# Patient Record
Sex: Male | Born: 1950 | Race: White | Hispanic: No | Smoking: Never smoker
Health system: Southern US, Community
[De-identification: ages and names within clinical notes are randomized; demographics above are authoritative.]

## PROBLEM LIST (undated history)

## (undated) DIAGNOSIS — N138 Other obstructive and reflux uropathy: Secondary | ICD-10-CM

## (undated) DIAGNOSIS — I4510 Unspecified right bundle-branch block: Secondary | ICD-10-CM

## (undated) DIAGNOSIS — Z8719 Personal history of other diseases of the digestive system: Secondary | ICD-10-CM

## (undated) DIAGNOSIS — N401 Enlarged prostate with lower urinary tract symptoms: Secondary | ICD-10-CM

## (undated) DIAGNOSIS — Z978 Presence of other specified devices: Secondary | ICD-10-CM

## (undated) HISTORY — PX: KNEE ARTHROSCOPY: SUR90

## (undated) HISTORY — PX: MOUTH SURGERY: SHX715

## (undated) HISTORY — PX: CATARACT EXTRACTION W/ INTRAOCULAR LENS IMPLANT: SHX1309

## (undated) HISTORY — PX: TONSILLECTOMY: SUR1361

---
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
## 1994-12-10 HISTORY — PX: ACHILLES TENDON REPAIR: SUR1153

## 2006-07-21 ENCOUNTER — Emergency Department (HOSPITAL_COMMUNITY): Admission: EM | Admit: 2006-07-21 | Discharge: 2006-07-21 | Payer: Self-pay | Admitting: Emergency Medicine

## 2013-04-13 ENCOUNTER — Ambulatory Visit: Payer: Self-pay | Admitting: *Deleted

## 2016-02-10 DIAGNOSIS — E119 Type 2 diabetes mellitus without complications: Secondary | ICD-10-CM

## 2016-02-10 DIAGNOSIS — E782 Mixed hyperlipidemia: Secondary | ICD-10-CM

## 2016-02-10 HISTORY — DX: Mixed hyperlipidemia: E78.2

## 2016-02-10 HISTORY — DX: Type 2 diabetes mellitus without complications: E11.9

## 2016-08-10 HISTORY — PX: SHOULDER ARTHROSCOPY WITH ROTATOR CUFF REPAIR AND SUBACROMIAL DECOMPRESSION: SHX5686

## 2017-04-03 DIAGNOSIS — I10 Essential (primary) hypertension: Secondary | ICD-10-CM | POA: Diagnosis present

## 2017-04-03 HISTORY — DX: Essential (primary) hypertension: I10

## 2021-07-10 DIAGNOSIS — Z8616 Personal history of COVID-19: Secondary | ICD-10-CM

## 2021-07-10 DIAGNOSIS — R339 Retention of urine, unspecified: Secondary | ICD-10-CM

## 2021-07-10 HISTORY — DX: Personal history of COVID-19: Z86.16

## 2021-07-10 HISTORY — DX: Retention of urine, unspecified: R33.9

## 2021-07-25 ENCOUNTER — Emergency Department (HOSPITAL_COMMUNITY): Payer: Medicare PPO

## 2021-07-25 ENCOUNTER — Inpatient Hospital Stay (HOSPITAL_COMMUNITY)
Admission: EM | Admit: 2021-07-25 | Discharge: 2021-07-28 | DRG: 689 | Disposition: A | Discharge status: Home Health | Payer: Medicare PPO | Attending: Internal Medicine | Admitting: Internal Medicine

## 2021-07-25 ENCOUNTER — Encounter (HOSPITAL_COMMUNITY): Payer: Self-pay | Admitting: Emergency Medicine

## 2021-07-25 DIAGNOSIS — N320 Bladder-neck obstruction: Secondary | ICD-10-CM | POA: Diagnosis present

## 2021-07-25 DIAGNOSIS — G928 Other toxic encephalopathy: Secondary | ICD-10-CM | POA: Diagnosis present

## 2021-07-25 DIAGNOSIS — N136 Pyonephrosis: Principal | ICD-10-CM | POA: Diagnosis present

## 2021-07-25 DIAGNOSIS — G9341 Metabolic encephalopathy: Secondary | ICD-10-CM | POA: Diagnosis not present

## 2021-07-25 DIAGNOSIS — E872 Acidosis: Secondary | ICD-10-CM | POA: Diagnosis present

## 2021-07-25 DIAGNOSIS — Z781 Physical restraint status: Secondary | ICD-10-CM

## 2021-07-25 DIAGNOSIS — Z79899 Other long term (current) drug therapy: Secondary | ICD-10-CM

## 2021-07-25 DIAGNOSIS — Z7984 Long term (current) use of oral hypoglycemic drugs: Secondary | ICD-10-CM

## 2021-07-25 DIAGNOSIS — R338 Other retention of urine: Secondary | ICD-10-CM | POA: Diagnosis present

## 2021-07-25 DIAGNOSIS — N401 Enlarged prostate with lower urinary tract symptoms: Secondary | ICD-10-CM | POA: Diagnosis present

## 2021-07-25 DIAGNOSIS — N12 Tubulo-interstitial nephritis, not specified as acute or chronic: Secondary | ICD-10-CM | POA: Diagnosis present

## 2021-07-25 DIAGNOSIS — E119 Type 2 diabetes mellitus without complications: Secondary | ICD-10-CM

## 2021-07-25 DIAGNOSIS — R6520 Severe sepsis without septic shock: Secondary | ICD-10-CM

## 2021-07-25 DIAGNOSIS — A419 Sepsis, unspecified organism: Secondary | ICD-10-CM

## 2021-07-25 DIAGNOSIS — Z8616 Personal history of COVID-19: Secondary | ICD-10-CM

## 2021-07-25 DIAGNOSIS — E860 Dehydration: Secondary | ICD-10-CM | POA: Diagnosis present

## 2021-07-25 DIAGNOSIS — E1169 Type 2 diabetes mellitus with other specified complication: Secondary | ICD-10-CM | POA: Diagnosis present

## 2021-07-25 DIAGNOSIS — E871 Hypo-osmolality and hyponatremia: Secondary | ICD-10-CM | POA: Diagnosis present

## 2021-07-25 DIAGNOSIS — E782 Mixed hyperlipidemia: Secondary | ICD-10-CM | POA: Diagnosis present

## 2021-07-25 DIAGNOSIS — Z7982 Long term (current) use of aspirin: Secondary | ICD-10-CM

## 2021-07-25 DIAGNOSIS — N390 Urinary tract infection, site not specified: Secondary | ICD-10-CM

## 2021-07-25 DIAGNOSIS — I10 Essential (primary) hypertension: Secondary | ICD-10-CM | POA: Diagnosis present

## 2021-07-25 LAB — COMPREHENSIVE METABOLIC PANEL
ALT: 12 U/L (ref 0–44)
AST: 19 U/L (ref 15–41)
Albumin: 3.8 g/dL (ref 3.5–5.0)
Alkaline Phosphatase: 49 U/L (ref 38–126)
Anion gap: 13 (ref 5–15)
BUN: 7 mg/dL — ABNORMAL LOW (ref 8–23)
CO2: 21 mmol/L — ABNORMAL LOW (ref 22–32)
Calcium: 8.8 mg/dL — ABNORMAL LOW (ref 8.9–10.3)
Chloride: 93 mmol/L — ABNORMAL LOW (ref 98–111)
Creatinine, Ser: 1.25 mg/dL — ABNORMAL HIGH (ref 0.61–1.24)
GFR, Estimated: >60 mL/min (ref >60)
Glucose, Bld: 155 mg/dL — ABNORMAL HIGH (ref 70–99)
Potassium: 3.7 mmol/L (ref 3.5–5.1)
Sodium: 127 mmol/L — ABNORMAL LOW (ref 135–145)
Total Bilirubin: 1.2 mg/dL (ref 0.3–1.2)
Total Protein: 7.1 g/dL (ref 6.5–8.1)

## 2021-07-25 LAB — SARS CORONAVIRUS 2 (TAT 6-24 HRS): SARS Coronavirus 2: POSITIVE — AB

## 2021-07-25 LAB — I-STAT VENOUS BLOOD GAS, ED
Acid-base deficit: 10 mmol/L — ABNORMAL HIGH (ref 0.0–2.0)
Bicarbonate: 12.8 mmol/L — ABNORMAL LOW (ref 20.0–28.0)
Calcium, Ion: 0.76 mmol/L — CL (ref 1.15–1.40)
HCT: 23 % — ABNORMAL LOW (ref 39.0–52.0)
Hemoglobin: 7.8 g/dL — ABNORMAL LOW (ref 13.0–17.0)
O2 Saturation: 67 %
Potassium: 2.3 mmol/L — CL (ref 3.5–5.1)
Sodium: 141 mmol/L (ref 135–145)
TCO2: 13 mmol/L — ABNORMAL LOW (ref 22–32)
pCO2, Ven: 19.8 mmHg — CL (ref 44.0–60.0)
pH, Ven: 7.42 (ref 7.250–7.430)
pO2, Ven: 33 mmHg (ref 32.0–45.0)

## 2021-07-25 LAB — URINALYSIS, ROUTINE W REFLEX MICROSCOPIC
Bilirubin Urine: NEGATIVE
Glucose, UA: NEGATIVE mg/dL
Ketones, ur: NEGATIVE mg/dL
Nitrite: NEGATIVE
Protein, ur: 100 mg/dL — AB
Specific Gravity, Urine: 1.005 (ref 1.005–1.030)
WBC, UA: >50 WBC/hpf — ABNORMAL HIGH (ref 0–5)
pH: 6 (ref 5.0–8.0)

## 2021-07-25 LAB — TSH: TSH: 3.421 u[IU]/mL (ref 0.350–4.500)

## 2021-07-25 LAB — SALICYLATE LEVEL: Salicylate Lvl: <7 mg/dL — ABNORMAL LOW (ref 7.0–30.0)

## 2021-07-25 LAB — CBC WITH DIFFERENTIAL/PLATELET
Abs Immature Granulocytes: 0.08 10*3/uL — ABNORMAL HIGH (ref 0.00–0.07)
Basophils Absolute: 0 10*3/uL (ref 0.0–0.1)
Basophils Relative: 1 %
Eosinophils Absolute: 0.1 10*3/uL (ref 0.0–0.5)
Eosinophils Relative: 2 %
HCT: 38.8 % — ABNORMAL LOW (ref 39.0–52.0)
Hemoglobin: 12.8 g/dL — ABNORMAL LOW (ref 13.0–17.0)
Immature Granulocytes: 1 %
Lymphocytes Relative: 13 %
Lymphs Abs: 0.8 10*3/uL (ref 0.7–4.0)
MCH: 27.5 pg (ref 26.0–34.0)
MCHC: 33 g/dL (ref 30.0–36.0)
MCV: 83.3 fL (ref 80.0–100.0)
Monocytes Absolute: 0.4 10*3/uL (ref 0.1–1.0)
Monocytes Relative: 7 %
Neutro Abs: 4.7 10*3/uL (ref 1.7–7.7)
Neutrophils Relative %: 76 %
Platelets: 174 10*3/uL (ref 150–400)
RBC: 4.66 MIL/uL (ref 4.22–5.81)
RDW: 14.6 % (ref 11.5–15.5)
WBC: 6.2 10*3/uL (ref 4.0–10.5)
nRBC: 0 % (ref 0.0–0.2)

## 2021-07-25 LAB — LACTIC ACID, PLASMA
Lactic Acid, Venous: 3.1 mmol/L (ref 0.5–1.9)
Lactic Acid, Venous: 2.7 mmol/L (ref 0.5–1.9)

## 2021-07-25 LAB — ACETAMINOPHEN LEVEL: Acetaminophen (Tylenol), Serum: <10 ug/mL — ABNORMAL LOW (ref 10–30)

## 2021-07-25 LAB — APTT: aPTT: 29 seconds (ref 24–36)

## 2021-07-25 LAB — T4, FREE: Free T4: 1.08 ng/dL (ref 0.61–1.12)

## 2021-07-25 LAB — PROTIME-INR
INR: 1.1 (ref 0.8–1.2)
Prothrombin Time: 14 seconds (ref 11.4–15.2)

## 2021-07-25 MED ORDER — LORAZEPAM 2 MG/ML IJ SOLN
2.0000 mg | Freq: Once | INTRAMUSCULAR | Status: AC
  Administered 2021-07-25: 2 mg via INTRAVENOUS
  Filled 2021-07-25: qty 1

## 2021-07-25 MED ORDER — SODIUM CHLORIDE 0.9 % IV SOLN
1.0000 g | Freq: Once | INTRAVENOUS | Status: AC
  Administered 2021-07-25: 1 g via INTRAVENOUS
  Filled 2021-07-25: qty 10

## 2021-07-25 MED ORDER — DIPHENHYDRAMINE HCL 50 MG/ML IJ SOLN
25.0000 mg | Freq: Once | INTRAMUSCULAR | Status: AC
  Administered 2021-07-25: 25 mg via INTRAVENOUS
  Filled 2021-07-25: qty 1

## 2021-07-25 MED ORDER — HALOPERIDOL LACTATE 5 MG/ML IJ SOLN
2.0000 mg | Freq: Once | INTRAMUSCULAR | Status: AC
  Administered 2021-07-25: 2 mg via INTRAVENOUS
  Filled 2021-07-25: qty 1

## 2021-07-25 MED ORDER — SODIUM CHLORIDE 0.9 % IV BOLUS
1000.0000 mL | Freq: Once | INTRAVENOUS | Status: AC
  Administered 2021-07-25: 1000 mL via INTRAVENOUS

## 2021-07-25 MED ORDER — POTASSIUM CHLORIDE 10 MEQ/100ML IV SOLN
10.0000 meq | INTRAVENOUS | Status: AC
Start: 2021-07-25 — End: 2021-07-26
  Administered 2021-07-25 (×2): 10 meq via INTRAVENOUS
  Filled 2021-07-25 (×2): qty 100

## 2021-07-25 MED ORDER — LACTATED RINGERS IV SOLN: INTRAVENOUS | Status: AC

## 2021-07-25 NOTE — ED Provider Notes (Signed)
[facility] EMERGENCY DEPARTMENT Provider Note   CSN: [id number] Arrival date & time: 07/25/21  1903     History Chief Complaint  Patient presents with   Altered Mental Status    Matthew Garza is a 70 y.o. male.  70 yo male with history above presenting to ED for delirium. Last known normal around 1100 or 1300 today per family. Pt with complaint of urinary issues over the past week, suprapubic discomfort per EMS/family. No emesis. Patient acting abnormally this afternoon, confused, delirium. Agitated with EMS and required restraints, haldol was given by EMS with IVF. Patient grabbing at his crotch, winces when  abdomen is palpated at suprapubic region.   Level 5 caveat, AMS  The history is provided by the EMS personnel and the patient. The history is limited by the condition of the patient. No language interpreter was used.  Altered Mental Status Associated symptoms: abdominal pain       History reviewed. No pertinent past medical history.  There are no problems to display for this patient.   History reviewed. No pertinent surgical history.     History reviewed. No pertinent family history.     Home Medications Prior to Admission medications   Not on File    Allergies    Patient has no allergy information on record.  Review of Systems   Review of Systems  Unable to perform ROS: Mental status change  Gastrointestinal:  Positive for abdominal pain.  Genitourinary:  Positive for difficulty urinating and dysuria.   Physical Exam Updated Vital Signs BP (!) 143/78   Pulse 97   Temp 98.3 F (36.8 C) (Axillary)   Resp (!) 26   SpO2 (!) 89%   Physical Exam Vitals and nursing note reviewed.  Constitutional:      Appearance: Normal appearance.  HENT:     Head: Normocephalic and atraumatic.     Right Ear: External ear normal.     Left Ear: External ear normal.     Nose: Nose normal.     Mouth/Throat:     Mouth: Mucous membranes are moist.   Eyes:     General: No scleral icterus.       Right eye: No discharge.        Left eye: No discharge.  Cardiovascular:     Rate and Rhythm: Normal rate and regular rhythm.     Pulses: Normal pulses.     Heart sounds: No murmur heard. Pulmonary:     Effort: Pulmonary effort is normal. No respiratory distress.     Breath sounds: Normal breath sounds. No wheezing.  Abdominal:     General: Abdomen is flat.     Palpations: Abdomen is soft.     Tenderness: There is abdominal tenderness.     Comments: Suprapubic TTP, not peritoneal abdomen   Musculoskeletal:        General: Normal range of motion.     Cervical back: Normal range of motion.  Skin:    General: Skin is warm.     Capillary Refill: Capillary refill takes less than 2 seconds.  Neurological:     Mental Status: He is alert. He is disoriented and confused.     GCS: GCS eye subscore is 4. GCS verbal subscore is 4. GCS motor subscore is 5.     Motor: No seizure activity.     Comments: Poor cooperation with neuro exam. Responds to noxious stimuli in all extremities. Eyes and extremities will cross midline. Will intermittently  say his name in response to questioning.     ED Results / Procedures / Treatments   Labs (all labs ordered are listed, but only abnormal results are displayed) Labs Reviewed  SARS CORONAVIRUS 2 (TAT 6-24 HRS) - Abnormal; Notable for the following components:      Result Value   SARS Coronavirus 2 POSITIVE (*)    All other components within normal limits  LACTIC ACID, PLASMA - Abnormal; Notable for the following components:   Lactic Acid, Venous 3.1 (*)    All other components within normal limits  LACTIC ACID, PLASMA - Abnormal; Notable for the following components:   Lactic Acid, Venous 2.7 (*)    All other components within normal limits  COMPREHENSIVE METABOLIC PANEL - Abnormal; Notable for the following components:   Sodium 127 (*)    Chloride 93 (*)    CO2 21 (*)    Glucose, Bld 155 (*)    BUN  7 (*)    Creatinine, Ser 1.25 (*)    Calcium 8.8 (*)    All other components within normal limits  CBC WITH DIFFERENTIAL/PLATELET - Abnormal; Notable for the following components:   Hemoglobin 12.8 (*)    HCT 38.8 (*)    Abs Immature Granulocytes 0.08 (*)    All other components within normal limits  URINALYSIS, ROUTINE W REFLEX MICROSCOPIC - Abnormal; Notable for the following components:   Color, Urine AMBER (*)    APPearance CLOUDY (*)    Hgb urine dipstick MODERATE (*)    Protein, ur 100 (*)    Leukocytes,Ua LARGE (*)    WBC, UA >50 (*)    Bacteria, UA MANY (*)    All other components within normal limits  SALICYLATE LEVEL - Abnormal; Notable for the following components:   Salicylate Lvl <7.0 (*)    All other components within normal limits  ACETAMINOPHEN LEVEL - Abnormal; Notable for the following components:   Acetaminophen (Tylenol), Serum <10 (*)    All other components within normal limits  I-STAT VENOUS BLOOD GAS, ED - Abnormal; Notable for the following components:   pCO2, Ven 19.8 (*)    Bicarbonate 12.8 (*)    TCO2 13 (*)    Acid-base deficit 10.0 (*)    Potassium 2.3 (*)    Calcium, Ion 0.76 (*)    HCT 23.0 (*)    Hemoglobin 7.8 (*)    All other components within normal limits  CULTURE, BLOOD (SINGLE)  URINE CULTURE  PROTIME-INR  APTT  TSH  T4, FREE  AMMONIA  RAPID URINE DRUG SCREEN, HOSP PERFORMED    EKG None  Radiology CT HEAD WO CONTRAST ( )  Result Date: 07/26/2021 CLINICAL DATA:  Delirium EXAM: CT HEAD WITHOUT CONTRAST TECHNIQUE: Contiguous axial images were obtained from the base of the skull through the vertex without intravenous contrast. COMPARISON:  None. FINDINGS: Brain: No evidence of acute infarction, hemorrhage, hydrocephalus, extra-axial collection or mass lesion/mass effect. Mild cortical atrophy. Mild subcortical white matter and periventricular small vessel ischemic changes. Vascular: No hyperdense vessel or unexpected  calcification. Skull: Normal. Negative for fracture or focal lesion. Sinuses/Orbits: The visualized paranasal sinuses are essentially clear. The mastoid air cells are unopacified. Other: None. IMPRESSION: No evidence of acute intracranial abnormality. Atrophy with mild small vessel ischemic changes. Electronically Signed   By: Charline Bills M.D.   On: 07/26/2021 00:19   CT ABDOMEN PELVIS W CONTRAST  Result Date: 07/26/2021 CLINICAL DATA:  Abdominal pain. EXAM: CT ABDOMEN AND PELVIS WITH  CONTRAST TECHNIQUE: Multidetector CT imaging of the abdomen and pelvis was performed using the standard protocol following bolus administration of intravenous contrast. CONTRAST:  85mL OMNIPAQUE IOHEXOL 350 MG/ML SOLN COMPARISON:  None. FINDINGS: Lower chest: Mild atelectasis is seen within the posterior aspects of the bilateral lung bases. Very small bilateral pleural effusions are noted. Hepatobiliary: A 5 mm focus of parenchymal low attenuation is seen within the right lobe of the liver. This is too small to characterize by CT examination. No gallstones, gallbladder wall thickening, or biliary dilatation. Pancreas: Unremarkable. No pancreatic ductal dilatation or surrounding inflammatory changes. Spleen: Normal in size without focal abnormality. Adrenals/Urinary Tract: Adrenal glands are unremarkable. Kidneys are normal in size, without focal lesions. Moderate severity bilateral hydronephrosis and hydroureter are noted, to the level of a markedly distended urinary bladder. Moderate to marked severity bilateral perinephric inflammatory fat stranding is also seen. There is mild inflammatory fat stranding surrounding the urinary bladder. Stomach/Bowel: Stomach is within normal limits. Appendix appears normal. No evidence of bowel wall thickening, distention, or inflammatory changes. Vascular/Lymphatic: Aortic atherosclerosis. No enlarged abdominal or pelvic lymph nodes. Reproductive: Moderate to marked severity prostate gland  enlargement is seen Other: No abdominal wall hernia or abnormality. No abdominopelvic ascites. Musculoskeletal: Degenerative changes are seen within the mid and lower lumbar spine. IMPRESSION: 1. Markedly distended urinary bladder with subsequent bilateral hydronephrosis and hydroureter, likely a result of urinary bladder outlet obstruction secondary to an enlarged prostate gland. 2. Additional findings consistent with mild cystitis. 3. While the perinephric inflammatory fat stranding is nonspecific in nature, sequelae associated with acute pyelonephritis cannot be excluded. Electronically Signed   By: Aram Candela M.D.   On: 07/26/2021 00:40   DG Chest Port 1 View  Result Date: 07/25/2021 CLINICAL DATA:  Sepsis EXAM: PORTABLE CHEST 1 VIEW COMPARISON:  None. FINDINGS: The heart size and mediastinal contours are within normal limits. Both lungs are clear. The visualized skeletal structures are unremarkable. IMPRESSION: No active disease. Electronically Signed   By: Deatra Robinson M.D.   On: 07/25/2021 21:13    Procedures .Critical Care  Date/Time: 07/26/2021 12:47 AM Performed by: Sloan Leiter, DO Authorized by: Sloan Leiter, DO   Critical care provider statement:    Critical care time (minutes):  36   Critical care time was exclusive of:  Separately billable procedures and treating other patients   Critical care was necessary to treat or prevent imminent or life-threatening deterioration of the following conditions:  Sepsis   Critical care was time spent personally by me on the following activities:  Discussions with consultants, evaluation of patient's response to treatment, examination of patient, ordering and performing treatments and interventions, ordering and review of laboratory studies, ordering and review of radiographic studies, pulse oximetry, re-evaluation of patient's condition, obtaining history from patient or surrogate, review of old charts and development of treatment plan  with patient or surrogate   Medications Ordered in ED Medications  potassium chloride 10 mEq in 100 mL IVPB (0 mEq Intravenous Stopped 07/26/21 0044)  lactated ringers infusion ( Intravenous New Bag/Given 07/25/21 2305)  LORazepam (ATIVAN) injection 2 mg (2 mg Intravenous Given 07/25/21 1941)  sodium chloride 0.9 % bolus 1,000 mL (0 mLs Intravenous Stopped 07/25/21 2243)  haloperidol lactate (HALDOL) injection 2 mg (2 mg Intravenous Given 07/25/21 2015)  diphenhydrAMINE (BENADRYL) injection 25 mg (25 mg Intravenous Given 07/25/21 2015)  cefTRIAXone (ROCEPHIN) 1 g in sodium chloride 0.9 % 100 mL IVPB (0 g Intravenous Stopped 07/25/21 2215)  LORazepam (  ATIVAN) injection 2 mg (2 mg Intravenous Given 07/25/21 2117)  LORazepam (ATIVAN) injection 2 mg (2 mg Intravenous Given 07/25/21 2305)  diphenhydrAMINE (BENADRYL) injection 25 mg (25 mg Intravenous Given 07/25/21 2307)  iohexol (OMNIPAQUE) 350 MG/ML injection 80 mL (80 mLs Intravenous Contrast Given 07/26/21 0017)    ED Course  I have reviewed the triage vital signs and the nursing notes.  Pertinent labs & imaging results that were available during my care of the patient were reviewed by me and considered in my medical decision making (see chart for details).    MDM Rules/Calculators/A&P                           70 yo male with history as above presenting to ED 2/2 AMS. Concern for GU infection per family. Pt requiring chemical and physical restraints. He is combative, encephalopathic on exam. Abdomen tender supra-pubic. Vital signs reviewed, tachypnea. Serious etiology considered.  Labs obtained and are concerning for sepsis. Blood cultures obtained. UA consistent with infection, urine culture sent. Start patient on rocephin. IVF. His mentation seems to be improving but he continues to require soft restraints and intermittent sedative medication dosing. Sitter requested. Patient with also mild dehydration, IVF in progress.  CT imaging reviewed,  CTH is stable. CT abdomen/pelvis concerning for bladder outlet obstruction likely 2/2 enlarged prostate, distended bladder. Will attempt in and out catheter. Cystitis noted. Pt with also possible pyelonephritis. Continue current ABX.   Re-assessed, mental status improving.   Pt with severe sepsis, metabolic encephalopathy likely 2/2 UTI vs Pyelonephritis. Recommend admission, family is agreeable. D/w admitting team who accepts patient for admission.    Final Clinical Impression(s) / ED Diagnoses Final diagnoses:  Metabolic encephalopathy  Severe sepsis (HCC)  Urinary tract infection with hematuria, site unspecified  Dehydration  Hyponatremia  Bladder obstruction    Rx / DC Orders ED Discharge Orders     None        Sloan LeiterGray, Stanly Si A, DO 07/26/21 0102

## 2021-07-25 NOTE — ED Notes (Signed)
Pt taken to ct, was unable to obtain due to movement, gave patient 2 of ativan per md, able to obtain chest x ray, cleaned patient of soiled garments, will attempt to obtain ekg once ativan takes effect

## 2021-07-25 NOTE — ED Triage Notes (Signed)
Pt BIB [facility] from home C/O AMS. Per EMS, family reports possible urinary frequency X 2 days, "got over COVID 5 days ago." 18 G LFA 500 mL NS, 2.5 Haldol en route BG 148 140/90 HR 104 96% RA  LKW 1300 today

## 2021-07-26 ENCOUNTER — Encounter (HOSPITAL_COMMUNITY): Payer: Self-pay | Admitting: Emergency Medicine

## 2021-07-26 ENCOUNTER — Emergency Department (HOSPITAL_COMMUNITY): Payer: Medicare PPO

## 2021-07-26 DIAGNOSIS — I10 Essential (primary) hypertension: Secondary | ICD-10-CM | POA: Diagnosis present

## 2021-07-26 DIAGNOSIS — G9341 Metabolic encephalopathy: Secondary | ICD-10-CM | POA: Diagnosis present

## 2021-07-26 DIAGNOSIS — N401 Enlarged prostate with lower urinary tract symptoms: Secondary | ICD-10-CM | POA: Diagnosis present

## 2021-07-26 DIAGNOSIS — E119 Type 2 diabetes mellitus without complications: Secondary | ICD-10-CM

## 2021-07-26 DIAGNOSIS — N12 Tubulo-interstitial nephritis, not specified as acute or chronic: Secondary | ICD-10-CM

## 2021-07-26 DIAGNOSIS — R338 Other retention of urine: Secondary | ICD-10-CM | POA: Diagnosis present

## 2021-07-26 DIAGNOSIS — G928 Other toxic encephalopathy: Secondary | ICD-10-CM | POA: Diagnosis present

## 2021-07-26 DIAGNOSIS — Z79899 Other long term (current) drug therapy: Secondary | ICD-10-CM | POA: Diagnosis not present

## 2021-07-26 DIAGNOSIS — Z7984 Long term (current) use of oral hypoglycemic drugs: Secondary | ICD-10-CM | POA: Diagnosis not present

## 2021-07-26 DIAGNOSIS — E1169 Type 2 diabetes mellitus with other specified complication: Secondary | ICD-10-CM | POA: Diagnosis present

## 2021-07-26 DIAGNOSIS — E860 Dehydration: Secondary | ICD-10-CM | POA: Diagnosis present

## 2021-07-26 DIAGNOSIS — Z8616 Personal history of COVID-19: Secondary | ICD-10-CM | POA: Diagnosis not present

## 2021-07-26 DIAGNOSIS — E782 Mixed hyperlipidemia: Secondary | ICD-10-CM | POA: Diagnosis present

## 2021-07-26 DIAGNOSIS — Z7982 Long term (current) use of aspirin: Secondary | ICD-10-CM | POA: Diagnosis not present

## 2021-07-26 DIAGNOSIS — N320 Bladder-neck obstruction: Secondary | ICD-10-CM | POA: Diagnosis present

## 2021-07-26 DIAGNOSIS — E871 Hypo-osmolality and hyponatremia: Secondary | ICD-10-CM | POA: Diagnosis present

## 2021-07-26 DIAGNOSIS — E872 Acidosis: Secondary | ICD-10-CM | POA: Diagnosis present

## 2021-07-26 DIAGNOSIS — N136 Pyonephrosis: Secondary | ICD-10-CM | POA: Diagnosis present

## 2021-07-26 DIAGNOSIS — Z781 Physical restraint status: Secondary | ICD-10-CM | POA: Diagnosis not present

## 2021-07-26 HISTORY — DX: Type 2 diabetes mellitus with other specified complication: E11.69

## 2021-07-26 LAB — COMPREHENSIVE METABOLIC PANEL
ALT: 13 U/L (ref 0–44)
AST: 19 U/L (ref 15–41)
Albumin: 3.4 g/dL — ABNORMAL LOW (ref 3.5–5.0)
Alkaline Phosphatase: 41 U/L (ref 38–126)
Anion gap: 7 (ref 5–15)
BUN: 6 mg/dL — ABNORMAL LOW (ref 8–23)
CO2: 24 mmol/L (ref 22–32)
Calcium: 8.7 mg/dL — ABNORMAL LOW (ref 8.9–10.3)
Chloride: 98 mmol/L (ref 98–111)
Creatinine, Ser: 1.27 mg/dL — ABNORMAL HIGH (ref 0.61–1.24)
GFR, Estimated: >60 mL/min (ref >60)
Glucose, Bld: 137 mg/dL — ABNORMAL HIGH (ref 70–99)
Potassium: 4.2 mmol/L (ref 3.5–5.1)
Sodium: 129 mmol/L — ABNORMAL LOW (ref 135–145)
Total Bilirubin: 1.2 mg/dL (ref 0.3–1.2)
Total Protein: 6.4 g/dL — ABNORMAL LOW (ref 6.5–8.1)

## 2021-07-26 LAB — MAGNESIUM: Magnesium: 1.6 mg/dL — ABNORMAL LOW (ref 1.7–2.4)

## 2021-07-26 LAB — CBG MONITORING, ED
Glucose-Capillary: 104 mg/dL — ABNORMAL HIGH (ref 70–99)
Glucose-Capillary: 170 mg/dL — ABNORMAL HIGH (ref 70–99)
Glucose-Capillary: 135 mg/dL — ABNORMAL HIGH (ref 70–99)

## 2021-07-26 LAB — CBC WITH DIFFERENTIAL/PLATELET
Abs Immature Granulocytes: 0.09 10*3/uL — ABNORMAL HIGH (ref 0.00–0.07)
Basophils Absolute: 0 10*3/uL (ref 0.0–0.1)
Basophils Relative: 0 %
Eosinophils Absolute: 0 10*3/uL (ref 0.0–0.5)
Eosinophils Relative: 0 %
HCT: 36.4 % — ABNORMAL LOW (ref 39.0–52.0)
Hemoglobin: 12.3 g/dL — ABNORMAL LOW (ref 13.0–17.0)
Immature Granulocytes: 1 %
Lymphocytes Relative: 8 %
Lymphs Abs: 0.7 10*3/uL (ref 0.7–4.0)
MCH: 28.1 pg (ref 26.0–34.0)
MCHC: 33.8 g/dL (ref 30.0–36.0)
MCV: 83.1 fL (ref 80.0–100.0)
Monocytes Absolute: 0.8 10*3/uL (ref 0.1–1.0)
Monocytes Relative: 9 %
Neutro Abs: 7.4 10*3/uL (ref 1.7–7.7)
Neutrophils Relative %: 82 %
Platelets: 165 10*3/uL (ref 150–400)
RBC: 4.38 MIL/uL (ref 4.22–5.81)
RDW: 14.3 % (ref 11.5–15.5)
WBC: 9.1 10*3/uL (ref 4.0–10.5)
nRBC: 0 % (ref 0.0–0.2)

## 2021-07-26 LAB — RAPID URINE DRUG SCREEN, HOSP PERFORMED
Amphetamines: NOT DETECTED
Barbiturates: NOT DETECTED
Benzodiazepines: POSITIVE — AB
Cocaine: NOT DETECTED
Opiates: NOT DETECTED
Tetrahydrocannabinol: NOT DETECTED

## 2021-07-26 LAB — PROTIME-INR
INR: 1.1 (ref 0.8–1.2)
Prothrombin Time: 14.6 seconds (ref 11.4–15.2)

## 2021-07-26 LAB — HEMOGLOBIN A1C
Hgb A1c MFr Bld: 6.3 % — ABNORMAL HIGH (ref 4.8–5.6)
Mean Plasma Glucose: 134.11 mg/dL

## 2021-07-26 LAB — LACTIC ACID, PLASMA: Lactic Acid, Venous: 1 mmol/L (ref 0.5–1.9)

## 2021-07-26 LAB — GLUCOSE, CAPILLARY: Glucose-Capillary: 112 mg/dL — ABNORMAL HIGH (ref 70–99)

## 2021-07-26 LAB — AMMONIA: Ammonia: 21 umol/L (ref 9–35)

## 2021-07-26 LAB — APTT: aPTT: 29 seconds (ref 24–36)

## 2021-07-26 MED ORDER — SODIUM CHLORIDE 0.9 % IV SOLN: INTRAVENOUS | Status: DC

## 2021-07-26 MED ORDER — LOSARTAN POTASSIUM 50 MG PO TABS
50.0000 mg | ORAL_TABLET | Freq: Every day | ORAL | Status: DC
  Administered 2021-07-26: 50 mg via ORAL
  Filled 2021-07-26: qty 1

## 2021-07-26 MED ORDER — ASPIRIN EC 81 MG PO TBEC
81.0000 mg | DELAYED_RELEASE_TABLET | Freq: Every day | ORAL | Status: DC
  Administered 2021-07-26 – 2021-07-28 (×3): 81 mg via ORAL
  Filled 2021-07-26 (×3): qty 1

## 2021-07-26 MED ORDER — IOHEXOL 350 MG/ML SOLN
80.0000 mL | Freq: Once | INTRAVENOUS | Status: AC | PRN
  Administered 2021-07-26: 80 mL via INTRAVENOUS

## 2021-07-26 MED ORDER — TAMSULOSIN HCL 0.4 MG PO CAPS
0.8000 mg | ORAL_CAPSULE | Freq: Every day | ORAL | Status: DC
  Administered 2021-07-26: 0.8 mg via ORAL
  Filled 2021-07-26: qty 2

## 2021-07-26 MED ORDER — POTASSIUM CHLORIDE 10 MEQ/100ML IV SOLN
INTRAVENOUS | Status: AC
  Administered 2021-07-26: 10 meq via INTRAVENOUS
  Filled 2021-07-26: qty 100

## 2021-07-26 MED ORDER — ONDANSETRON HCL 4 MG/2ML IJ SOLN: 4.0000 mg | Freq: Four times a day (QID) | INTRAMUSCULAR | Status: DC | PRN

## 2021-07-26 MED ORDER — SODIUM CHLORIDE 0.9 % IV SOLN
1.0000 g | INTRAVENOUS | Status: DC
  Administered 2021-07-26 – 2021-07-27 (×2): 1 g via INTRAVENOUS
  Filled 2021-07-26 (×2): qty 10

## 2021-07-26 MED ORDER — SODIUM CHLORIDE 0.9 % IV SOLN: INTRAVENOUS | Status: AC

## 2021-07-26 MED ORDER — ACETAMINOPHEN 325 MG PO TABS: 650.0000 mg | ORAL_TABLET | Freq: Four times a day (QID) | ORAL | Status: DC | PRN

## 2021-07-26 MED ORDER — POLYETHYLENE GLYCOL 3350 17 G PO PACK: 17.0000 g | PACK | Freq: Every day | ORAL | Status: DC | PRN

## 2021-07-26 MED ORDER — ONDANSETRON HCL 4 MG PO TABS: 4.0000 mg | ORAL_TABLET | Freq: Four times a day (QID) | ORAL | Status: DC | PRN

## 2021-07-26 MED ORDER — ACETAMINOPHEN 650 MG RE SUPP: 650.0000 mg | Freq: Four times a day (QID) | RECTAL | Status: DC | PRN

## 2021-07-26 MED ORDER — CHLORHEXIDINE GLUCONATE CLOTH 2 % EX PADS
6.0000 | MEDICATED_PAD | Freq: Every day | CUTANEOUS | Status: DC
  Administered 2021-07-27 – 2021-07-28 (×2): 6 via TOPICAL

## 2021-07-26 MED ORDER — SIMVASTATIN 20 MG PO TABS
40.0000 mg | ORAL_TABLET | Freq: Every day | ORAL | Status: DC
  Administered 2021-07-26 – 2021-07-28 (×3): 40 mg via ORAL
  Filled 2021-07-26 (×3): qty 2

## 2021-07-26 MED ORDER — TAMSULOSIN HCL 0.4 MG PO CAPS
0.4000 mg | ORAL_CAPSULE | Freq: Every day | ORAL | Status: DC
  Administered 2021-07-27 – 2021-07-28 (×2): 0.4 mg via ORAL
  Filled 2021-07-26 (×2): qty 1

## 2021-07-26 MED ORDER — INSULIN ASPART 100 UNIT/ML IJ SOLN
0.0000 [IU] | Freq: Three times a day (TID) | INTRAMUSCULAR | Status: DC
  Administered 2021-07-26: 3 [IU] via SUBCUTANEOUS
  Administered 2021-07-26 – 2021-07-27 (×3): 2 [IU] via SUBCUTANEOUS
  Administered 2021-07-28: 3 [IU] via SUBCUTANEOUS

## 2021-07-26 NOTE — ED Notes (Signed)
Attempted in & out cath. Several clots noted when attempting cath. Little urine return. Will make MD aware.

## 2021-07-26 NOTE — ED Notes (Signed)
Bladder scanner read >971ml. MD made aware.

## 2021-07-26 NOTE — H&P (Signed)
History and Physical    Matthew Garza ZYS:[id number] DOB: October 19, 1951 DOA: 07/25/2021  PCP: Pcp, No  Patient coming from: Home via EMS   Chief Complaint:  Chief Complaint  Patient presents with   Altered Mental Status     HPI:    70 year old male with past medical history of diabetes mellitus type 2, hypertension, hyperlipidemia, benign prostatic hyperplasia who presents with a several day history of progressively worsening abdominal discomfort and confusion.  Patient is unable to provide significant history due to severe confusion.  Majority the history has been obtained from the daughter via phone conversation.  According to the daughter, the entire family was diagnosed with COVID-19 on 8/5 via home testing after one family member who works in a daycare center ended up being positive.  The patient experienced no symptoms with this illness except for some fatigue.  Several days later, the patient began to complain of some lower abdominal pain and dysuria.  The daughter recalls that she and the rest of the family thought that this was related to the patient's recent COVID-19 illness.  As the days progressed patient continued to complain of lower abdominal pain and dysuria and she insisted that her father (who lives alone) call his primary care provider to discuss his progressive symptoms.  She states that he did not.  Of note, patient was complaining of urinary obstructive symptoms to his primary care provider as early as 10/2020.  Patient was not placed on any medical therapies for this at that time however.  Daughter explained that within the past 24 hours the patient had suddenly become extremely confused, somewhat lethargic with periods of agitation.  She contacted EMS who promptly arrived and upon their arrival patient was extremely agitated requiring EMS to implement restraints.  EMS proceeded to bring the patient into [facility] emergency department for evaluation  Upon  evaluation in the emergency department CT imaging of the abdomen pelvis revealed a markedly distended bladder with bilateral hydronephrosis and hydroureter thought to be secondary to bladder outlet obstruction due to enlarged prostate.  There is also evidence of bilateral perinephric fat stranding which could possibly be secondary pyelonephritis.  Urinalysis was obtained revealing greater than 50 white blood cells per high-powered field as well as leukocyte esterase concerning for urinary tract infection.  Patient was initiated on intravenous ceftriaxone.  Patient was given 1 L normal saline fluid bolus.  The hospitalist group was then called to assess the patient for admission to the hospital.   Review of Systems:   Review of Systems  Unable to perform ROS: Mental status change   Past Medical History:  Diagnosis Date   Essential hypertension 04/03/2017   Mixed diabetic hyperlipidemia associated with type 2 diabetes mellitus (HCC) 07/26/2021   Mixed hyperlipidemia 02/10/2016   Type 2 diabetes mellitus without complication, without long-term current use of insulin (HCC) 02/10/2016    History reviewed. No pertinent surgical history.   reports that he has never smoked. He has never used smokeless tobacco. No history on file for alcohol use and drug use.  No Known Allergies  Family History  Problem Relation Age of Onset   Heart disease Neg Hx      Prior to Admission medications   Medication Sig Start Date End Date Taking? Authorizing Provider  Aspirin 81 MG CAPS Take 81 mg by mouth daily.   Yes [provider]  diazepam (VALIUM) 10 MG tablet Take 10 mg by mouth at bedtime as needed for sleep.  Yes [provider]  losartan (COZAAR) 50 MG tablet Take 50 mg by mouth daily.   Yes [provider]  metFORMIN (GLUCOPHAGE) 1000 MG tablet Take 1,000 mg by mouth daily with breakfast.   Yes [provider]  sildenafil (VIAGRA) 100 MG tablet Take 100 mg by mouth  daily as needed for erectile dysfunction.   Yes [provider]  simvastatin (ZOCOR) 40 MG tablet Take 40 mg by mouth daily.   Yes [provider]  sitaGLIPtin (JANUVIA) 100 MG tablet Take 100 mg by mouth daily.   Yes [provider]    Physical Exam: Vitals:   07/26/21 0430 07/26/21 0630 07/26/21 0700 07/26/21 0730  BP: (!) 144/82 134/73 (!) 141/75 (!) 141/77  Pulse: 99 88 95 91  Resp: (!) 22   20  Temp:      TempSrc:      SpO2: 95% 96% 99% 97%    Constitutional: Lethargic but arousable, oriented x1, no associated distress.   Skin: no rashes, no lesions, poor skin turgor noted. Eyes: Pupils are equally reactive to light.  No evidence of scleral icterus or conjunctival pallor.  ENMT: Extremely dry mucous membranes noted.  Posterior pharynx clear of any exudate or lesions.   Neck: normal, supple, no masses, no thyromegaly.  No evidence of jugular venous distension.   Respiratory: clear to auscultation bilaterally, no wheezing, no crackles. Normal respiratory effort. No accessory muscle use.  Cardiovascular: Regular rate and rhythm, no murmurs / rubs / gallops. No extremity edema. 2+ pedal pulses. No carotid bruits.  Chest:   Nontender without crepitus or deformity.   Back:   Nontender without crepitus or deformity. Abdomen: Notable lower abdominal tenderness.  Abdomen is soft however.  Lower abdominal fullness has improved with insertion of Foley catheter.    Positive bowel sounds noted in all quadrants.   GU: Foley catheter in place draining slightly rose tinted urine.  Musculoskeletal: No joint deformity upper and lower extremities. Good ROM, no contractures. Normal muscle tone.  Neurologic: Lethargic but arousable and oriented x1.  Patient is not consistently following commands.  Patient is responsive to verbal and painful stimuli.  Patient is moving all 4 extremities.  Patient's sensation is grossly intact.   Psychiatric: Unable to assess due to severe  lethargy and confusion.  Patient currently does not seem to possess insight as to their current situation.  Labs on Admission: I have personally reviewed following labs and imaging studies -   CBC: Recent Labs  Lab 07/25/21 1908 07/25/21 1941 07/26/21 0513  WBC 6.2  --  9.1  NEUTROABS 4.7  --  7.4  HGB 12.8* 7.8* 12.3*  HCT 38.8* 23.0* 36.4*  MCV 83.3  --  83.1  PLT 174  --  165   Basic Metabolic Panel: Recent Labs  Lab 07/25/21 1908 07/25/21 1941 07/26/21 0513  NA 127* 141 129*  K 3.7 2.3* 4.2  CL 93*  --  98  CO2 21*  --  24  GLUCOSE 155*  --  137*  BUN 7*  --  6*  CREATININE 1.25*  --  1.27*  CALCIUM 8.8*  --  8.7*  MG  --   --  1.6*   GFR: CrCl cannot be calculated (Unknown ideal weight.). Liver Function Tests: Recent Labs  Lab 07/25/21 1908 07/26/21 0513  AST 19 19  ALT 12 13  ALKPHOS 49 41  BILITOT 1.2 1.2  PROT 7.1 6.4*  ALBUMIN 3.8 3.4*   No results  for input(s): LIPASE, AMYLASE in the last 168 hours. Recent Labs  Lab 07/26/21 0221  AMMONIA 21   Coagulation Profile: Recent Labs  Lab 07/25/21 1908 07/26/21 0513  INR 1.1 1.1   Cardiac Enzymes: No results for input(s): CKTOTAL, CKMB, CKMBINDEX, TROPONINI in the last 168 hours. BNP (last 3 results) No results for input(s): PROBNP in the last 8760 hours. HbA1C: Recent Labs    07/26/21 0513  HGBA1C 6.3*   CBG: Recent Labs  Lab 07/26/21 0746  GLUCAP 104*   Lipid Profile: No results for input(s): CHOL, HDL, LDLCALC, TRIG, CHOLHDL, LDLDIRECT in the last 72 hours. Thyroid Function Tests: Recent Labs    07/25/21 1908 07/25/21 1910  TSH  --  3.421  FREET4 1.08  --    Anemia Panel: No results for input(s): VITAMINB12, FOLATE, FERRITIN, TIBC, IRON, RETICCTPCT in the last 72 hours. Urine analysis:    Component Value Date/Time   COLORURINE AMBER (A) 07/25/2021 1908   APPEARANCEUR CLOUDY (A) 07/25/2021 1908   LABSPEC 1.005 07/25/2021 1908   PHURINE 6.0 07/25/2021 1908   GLUCOSEU  NEGATIVE 07/25/2021 1908   HGBUR MODERATE (A) 07/25/2021 1908   BILIRUBINUR NEGATIVE 07/25/2021 1908   KETONESUR NEGATIVE 07/25/2021 1908   PROTEINUR 100 (A) 07/25/2021 1908   NITRITE NEGATIVE 07/25/2021 1908   LEUKOCYTESUR LARGE (A) 07/25/2021 1908    Radiological Exams on Admission - Personally Reviewed: CT HEAD WO CONTRAST ( )  Result Date: 07/26/2021 CLINICAL DATA:  Delirium EXAM: CT HEAD WITHOUT CONTRAST TECHNIQUE: Contiguous axial images were obtained from the base of the skull through the vertex without intravenous contrast. COMPARISON:  None. FINDINGS: Brain: No evidence of acute infarction, hemorrhage, hydrocephalus, extra-axial collection or mass lesion/mass effect. Mild cortical atrophy. Mild subcortical white matter and periventricular small vessel ischemic changes. Vascular: No hyperdense vessel or unexpected calcification. Skull: Normal. Negative for fracture or focal lesion. Sinuses/Orbits: The visualized paranasal sinuses are essentially clear. The mastoid air cells are unopacified. Other: None. IMPRESSION: No evidence of acute intracranial abnormality. Atrophy with mild small vessel ischemic changes. Electronically Signed   By: Charline Bills M.D.   On: 07/26/2021 00:19   CT ABDOMEN PELVIS W CONTRAST  Result Date: 07/26/2021 CLINICAL DATA:  Abdominal pain. EXAM: CT ABDOMEN AND PELVIS WITH CONTRAST TECHNIQUE: Multidetector CT imaging of the abdomen and pelvis was performed using the standard protocol following bolus administration of intravenous contrast. CONTRAST:  61mL OMNIPAQUE IOHEXOL 350 MG/ML SOLN COMPARISON:  None. FINDINGS: Lower chest: Mild atelectasis is seen within the posterior aspects of the bilateral lung bases. Very small bilateral pleural effusions are noted. Hepatobiliary: A 5 mm focus of parenchymal low attenuation is seen within the right lobe of the liver. This is too small to characterize by CT examination. No gallstones, gallbladder wall thickening, or  biliary dilatation. Pancreas: Unremarkable. No pancreatic ductal dilatation or surrounding inflammatory changes. Spleen: Normal in size without focal abnormality. Adrenals/Urinary Tract: Adrenal glands are unremarkable. Kidneys are normal in size, without focal lesions. Moderate severity bilateral hydronephrosis and hydroureter are noted, to the level of a markedly distended urinary bladder. Moderate to marked severity bilateral perinephric inflammatory fat stranding is also seen. There is mild inflammatory fat stranding surrounding the urinary bladder. Stomach/Bowel: Stomach is within normal limits. Appendix appears normal. No evidence of bowel wall thickening, distention, or inflammatory changes. Vascular/Lymphatic: Aortic atherosclerosis. No enlarged abdominal or pelvic lymph nodes. Reproductive: Moderate to marked severity prostate gland enlargement is seen Other: No abdominal wall hernia or abnormality. No abdominopelvic ascites. Musculoskeletal:  Degenerative changes are seen within the mid and lower lumbar spine. IMPRESSION: 1. Markedly distended urinary bladder with subsequent bilateral hydronephrosis and hydroureter, likely a result of urinary bladder outlet obstruction secondary to an enlarged prostate gland. 2. Additional findings consistent with mild cystitis. 3. While the perinephric inflammatory fat stranding is nonspecific in nature, sequelae associated with acute pyelonephritis cannot be excluded. Electronically Signed   By: Aram Candela M.D.   On: 07/26/2021 00:40   DG Chest Port 1 View  Result Date: 07/25/2021 CLINICAL DATA:  Sepsis EXAM: PORTABLE CHEST 1 VIEW COMPARISON:  None. FINDINGS: The heart size and mediastinal contours are within normal limits. Both lungs are clear. The visualized skeletal structures are unremarkable. IMPRESSION: No active disease. Electronically Signed   By: Deatra Robinson M.D.   On: 07/25/2021 21:13    EKG: Personally reviewed.  Rhythm is sinus tachycardia with  heart rate of 110BPM.  No dynamic ST segment changes appreciated.  Assessment/Plan Principal Problem: Urinary tract infection with bilateral pyelonephritis  Patient presenting with several days of worsening dysuria, lower abdominal pain and urinary retention UA suggestive of UTI CT suggestive of bilateral pyelonephritis Etiology is likely due to prolonged urinary retention IV Ceftraixone initiated  Blood and urine cultures obtained Hydrating with isotonic saline  Active Problems:   Urinary retention due to benign prostatic hyperplasia  Known Hx of BPH since at least 10/2020 based on PCP note where urinary obstructive symptoms are mentioned but no therapy was started Foley catheter was placed in the Ed using a coude catheter resulting in approximately 3000cc of urine output.  Flomax will need to be initiated  Patient will likely need to keep foley in until follow up with urology in clinic but may benefit from inpatient urology consultation or curbisde at discretion of day provider.  I am concerned patient may pull at foley if he remains confused and great care must be taken to avoid this.     Acute metabolic encephalopathy  Patient suffering from metabolic encephalopathy likely secondary to UTI Treating underlying UTI with IV Abx Monitoring for resolution of symptoms If encephalopathy does not quickly resolve will expand workup  Covid 19 positive test  COVID 19 PCR test has come back positive Daughter explains that patient was positive via home antigen test on 8/5 Patient exhibitied minimal symptoms at the time which seem to have resolved Patient currently exhibits no infiltrates, no hypoxia, no cough Patient is well out of outpatient isolation period (5 days) and is even out of the inpatient isolation period (10 days).  No isolation necessary     Type 2 diabetes mellitus without complication, without long-term current use of insulin (HCC)  Accu-Cheks before every meal and  nightly with sliding scale insulin Hemoglobin A1c pending Holding home regimen of metformin and Januvia    Hyponatremia  Mild/moderate hyponatremia Fully expect this to resolve , minimal risk of overcorrection Monitoring sodium levels with serial chemistries    Mixed diabetic hyperlipidemia associated with type 2 diabetes mellitus (HCC)  Continue statin therapy    Essential hypertension  Continue home regimen of antihypertensive therapy   Code Status:  Full code Family Communication: Plan of care discussed with daugther via phone conversation.   Status is: Inpatient  Remains inpatient appropriate because:Ongoing diagnostic testing needed not appropriate for outpatient work up, IV treatments appropriate due to intensity of illness or inability to take PO, and Inpatient level of care appropriate due to severity of illness  Dispo: The patient is from: Home  Anticipated d/c is to: Home              Patient currently is not medically stable to d/c.   Difficult to place patient No        Marinda Elk MD [facility] Pager [phone]  If 7PM-7AM, please contact night-coverage [email or web address] Use universal [place] password for that web site. If you do not have the password, please call the hospital operator.  07/26/2021, 8:11 AM

## 2021-07-26 NOTE — ED Notes (Signed)
Full bed change completed, pt dry, comfortable, warm blanket provided.

## 2021-07-26 NOTE — Progress Notes (Signed)
Patient seen and examined, admitted earlier this morning by Dr. Leafy Half, please see his H&P for details. -Briefly Matthew Garza is a 70 year old male with history of type 2 diabetes mellitus hypertension, BPH, dyslipidemia presented to the ED with lower abdominal discomfort, urinary frequency and confusion.  Reportedly he was diagnosed with COVID last week after which he started to experience some fatigue, several days later started complaining of lower abdominal pain and dysuria.  24 hours prior to admission became extremely confused, lethargic with periods of agitation, EMS was called, he required restraints and was brought to the emergency room. In the ED he was noted to have lactic acidosis, hyponatremia, abnormal urinalysis, positive COVID PCR, CT abdomen pelvis noted markedly distended bladder with bilateral hydronephrosis and hydroureter secondary to bladder outlet obstruction from an enlarged prostate with bilateral perinephric stranding, could be secondary pyelonephritis  Assessment and plan  Toxic encephalopathy -Secondary to UTI, pyelonephritis from bladder outlet obstruction -Foley catheter placed in the ED earlier this morning -Continue IV ceftriaxone, follow-up urine cultures -Continue IV fluids today  BPH, urinary retention -Foley placed in the ED overnight due to acute retention resulting in 3 L of urine -Add Flomax -Will need urology follow-up at discharge  Positive COVID PCR -Relatively asymptomatic -Reportedly had a positive home antigen test on 8/5, has completed 10 days of inpatient isolation.  Type 2 diabetes mellitus -Metformin and Januvia on hold, continue sliding scale insulin  Hyponatremia -Continue gentle IV fluids today, at risk of overdiuresis today following relief of bladder outlet obstruction, monitor BMP  Hypertension -Holding Cozaar  Zannie Cove, MD

## 2021-07-26 NOTE — ED Notes (Signed)
Report given to Alexis, RN

## 2021-07-27 LAB — URINE CULTURE

## 2021-07-27 LAB — GLUCOSE, CAPILLARY
Glucose-Capillary: 106 mg/dL — ABNORMAL HIGH (ref 70–99)
Glucose-Capillary: 149 mg/dL — ABNORMAL HIGH (ref 70–99)
Glucose-Capillary: 114 mg/dL — ABNORMAL HIGH (ref 70–99)
Glucose-Capillary: 136 mg/dL — ABNORMAL HIGH (ref 70–99)

## 2021-07-27 MED ORDER — ENOXAPARIN SODIUM 40 MG/0.4ML IJ SOSY
40.0000 mg | PREFILLED_SYRINGE | INTRAMUSCULAR | Status: DC
  Administered 2021-07-27: 40 mg via SUBCUTANEOUS
  Filled 2021-07-27: qty 0.4

## 2021-07-27 NOTE — Progress Notes (Addendum)
PROGRESS NOTE    Matthew Garza  ZOX:[id number] DOB: 12-Dec-1950 DOA: 07/25/2021 PCP: Pcp, No  Brief Narrative:Mr. Matthew PriceMyers is a [age] year old male with history of type 2 diabetes mellitus hypertension, BPH, dyslipidemia presented to the ED with lower abdominal discomfort, urinary frequency and confusion.  Reportedly he was diagnosed with COVID last week after which he started to experience some fatigue, several days later started complaining of lower abdominal pain and dysuria.  24 hours prior to admission became extremely confused, lethargic with periods of agitation, EMS was called, he required restraints and was brought to the emergency room. In the ED he was noted to have lactic acidosis, hyponatremia, abnormal urinalysis, positive COVID PCR, CT abdomen pelvis noted markedly distended bladder with bilateral hydronephrosis and hydroureter secondary to bladder outlet obstruction from an enlarged prostate with bilateral perinephric stranding, could be secondary pyelonephritis. -Foley catheter was placed in the ER, 3 L of urine drained -Started on IV ceftriaxone and IV fluids   Assessment & Plan:   Toxic encephalopathy -Secondary to UTI, pyelonephritis from bladder outlet obstruction -Foley catheter placed in the ED 8/17 morning -Continue IV ceftriaxone, urine culture with multiple species, will repeat -Discontinue IV fluids -Ambulate, PT OT eval   BPH, urinary retention -Foley placed in the ED due to acute retention resulting in 3 L of urine -Started on Flomax -Will need urology follow-up at discharge   Positive COVID PCR -Relatively asymptomatic -Reportedly had a positive home antigen test on 8/5, has completed 10 days of inpatient isolation.   Type 2 diabetes mellitus -Metformin and Januvia on hold, continue sliding scale insulin   Hyponatremia -Sodium improving, continue to monitor   Hypertension -Holding Cozaar   DVT prophylaxis: Add Lovenox Code Status: Full code Family  Communication: No family at bedside, updated daughter Disposition Plan:  Status is: Inpatient  Remains inpatient appropriate because:Inpatient level of care appropriate due to severity of illness  Dispo: The patient is from: Home              [facility]              Patient currently is not medically stable to d/c.   Difficult to place patient No   Procedures:   Antimicrobials:    Subjective: -complains of being uncomfortable here, reports being tied to the room  Objective: Vitals:   07/27/21 0417 07/27/21 0618 07/27/21 0815 07/27/21 1251  BP: 124/78 (!) 129/58 108/61 115/62  Pulse: 63  71 75  Resp: 18  16 14   Temp: 98.6 F (37 C)  98 F (36.7 C)   TempSrc: Oral  Oral   SpO2: 93%  95%     Intake/Output Summary (Last 24 hours) at 07/27/2021 1447 Last data filed at 07/27/2021 0800 Gross per 24 hour  Intake 630 ml  Output 2050 ml  Net -1420 ml   There were no vitals filed for this visit.  Examination:  General exam: Elderly male, sitting up in bed, awake alert oriented to self and place, some cognitive deficits noted Respiratory system: Decreased breath sounds the bases otherwise clear Cardiovascular system: S1 & S2 heard, RRR.  Abd: nondistended, soft and nontender.Normal bowel sounds heard. Central nervous system: Awake and alert, oriented x2, some confusion, no focal findings GU: Foley catheter noted Extremities: no edema Skin: No rashes Psychiatry: Poor insight and judgment   Data Reviewed:   CBC: Recent Labs  Lab 07/25/21 1908 07/25/21 1941 07/26/21 0513  WBC 6.2  --  9.1  NEUTROABS 4.7  --  7.4  HGB 12.8* 7.8* 12.3*  HCT 38.8* 23.0* 36.4*  MCV 83.3  --  83.1  PLT 174  --  165   Basic Metabolic Panel: Recent Labs  Lab 07/25/21 1908 07/25/21 1941 07/26/21 0513  NA 127* 141 129*  K 3.7 2.3* 4.2  CL 93*  --  98  CO2 21*  --  24  GLUCOSE 155*  --  137*  BUN 7*  --  6*  CREATININE 1.25*  --  1.27*  CALCIUM 8.8*  --   8.7*  MG  --   --  1.6*   GFR: CrCl cannot be calculated (Unknown ideal weight.). Liver Function Tests: Recent Labs  Lab 07/25/21 1908 07/26/21 0513  AST 19 19  ALT 12 13  ALKPHOS 49 41  BILITOT 1.2 1.2  PROT 7.1 6.4*  ALBUMIN 3.8 3.4*   No results for input(s): LIPASE, AMYLASE in the last 168 hours. Recent Labs  Lab 07/26/21 0221  AMMONIA 21   Coagulation Profile: Recent Labs  Lab 07/25/21 1908 07/26/21 0513  INR 1.1 1.1   Cardiac Enzymes: No results for input(s): CKTOTAL, CKMB, CKMBINDEX, TROPONINI in the last 168 hours. BNP (last 3 results) No results for input(s): PROBNP in the last 8760 hours. HbA1C: Recent Labs    07/26/21 0513  HGBA1C 6.3*   CBG: Recent Labs  Lab 07/26/21 1121 07/26/21 1635 07/26/21 2019 07/27/21 0818 07/27/21 1254  GLUCAP 170* 135* 112* 106* 149*   Lipid Profile: No results for input(s): CHOL, HDL, LDLCALC, TRIG, CHOLHDL, LDLDIRECT in the last 72 hours. Thyroid Function Tests: Recent Labs    07/25/21 1908 07/25/21 1910  TSH  --  3.421  FREET4 1.08  --    Anemia Panel: No results for input(s): VITAMINB12, FOLATE, FERRITIN, TIBC, IRON, RETICCTPCT in the last 72 hours. Urine analysis:    Component Value Date/Time   COLORURINE AMBER (A) 07/25/2021 1908   APPEARANCEUR CLOUDY (A) 07/25/2021 1908   LABSPEC 1.005 07/25/2021 1908   PHURINE 6.0 07/25/2021 1908   GLUCOSEU NEGATIVE 07/25/2021 1908   HGBUR MODERATE (A) 07/25/2021 1908   BILIRUBINUR NEGATIVE 07/25/2021 1908   KETONESUR NEGATIVE 07/25/2021 1908   PROTEINUR 100 (A) 07/25/2021 1908   NITRITE NEGATIVE 07/25/2021 1908   LEUKOCYTESUR LARGE (A) 07/25/2021 1908   Sepsis Labs: @LABRCNTIP (procalcitonin:4,lacticidven:4)  ) Recent Results (from the past 240 hour(s))  Blood culture (routine single)     Status: None (Preliminary result)   Collection Time: 07/25/21  7:08 PM   Specimen: BLOOD  Result Value Ref Range Status   Specimen Description BLOOD SITE NOT SPECIFIED   Final   Special Requests   Final    BOTTLES DRAWN AEROBIC AND ANAEROBIC Blood Culture adequate volume   Culture   Final    NO GROWTH 2 DAYS Performed at [facility], [address] [place], [place]    Report Status PENDING  Incomplete  SARS CORONAVIRUS 2 (TAT 6-24 HRS) Nasopharyngeal Nasopharyngeal Swab     Status: Abnormal   Collection Time: 07/25/21  7:09 PM   Specimen: Nasopharyngeal Swab  Result Value Ref Range Status   SARS Coronavirus 2 POSITIVE (A) NEGATIVE Final    Comment: (NOTE) SARS-CoV-2 target nucleic acids are DETECTED.  The SARS-CoV-2 RNA is generally detectable in upper and lower respiratory specimens during the acute phase of infection. Positive results are indicative of the presence of SARS-CoV-2 RNA. Clinical correlation with patient history and other diagnostic information is  necessary to determine patient infection status. Positive results do not rule out bacterial infection or co-infection with other viruses.  The expected result is Negative.  Fact Sheet for Patients: HairSlick.no  Fact Sheet for Healthcare Providers: quierodirigir.com  This test is not yet approved or cleared by the [place] FDA and  has been authorized for detection and/or diagnosis of SARS-CoV-2 by FDA under an Emergency Use Authorization (EUA). This EUA will remain  in effect (meaning this test can be used) for the duration of the COVID-19 declaration under Section 564(b)(1) of the Act, 21 U. S.C. section 360bbb-3(b)(1), unless the authorization is terminated or revoked sooner.   Performed at [facility], [address] [place], [place] [address]   Urine Culture     Status: Abnormal   Collection Time: 07/25/21  9:34 PM   Specimen: In/Out Cath Urine  Result Value Ref Range Status   Specimen Description IN/OUT CATH URINE  Final   Special Requests   Final    NONE Performed at [facility], [address] [place], [place] [address]    Culture MULTIPLE SPECIES PRESENT, SUGGEST RECOLLECTION (A)  Final   Report Status 07/27/2021 FINAL  Final         Radiology Studies: CT HEAD WO CONTRAST ( )  Result Date: 07/26/2021 CLINICAL DATA:  Delirium EXAM: CT HEAD WITHOUT CONTRAST TECHNIQUE: Contiguous axial images were obtained from the base of the skull through the vertex without intravenous contrast. COMPARISON:  None. FINDINGS: Brain: No evidence of acute infarction, hemorrhage, hydrocephalus, extra-axial collection or mass lesion/mass effect. Mild cortical atrophy. Mild subcortical white matter and periventricular small vessel ischemic changes. Vascular: No hyperdense vessel or unexpected calcification. Skull: Normal. Negative for fracture or focal lesion. Sinuses/Orbits: The visualized paranasal sinuses are essentially clear. The mastoid air cells are unopacified. Other: None. IMPRESSION: No evidence of acute intracranial abnormality. Atrophy with mild small vessel ischemic changes. Electronically Signed   By: Charline Bills M.D.   On: 07/26/2021 00:19   CT ABDOMEN PELVIS W CONTRAST  Result Date: 07/26/2021 CLINICAL DATA:  Abdominal pain. EXAM: CT ABDOMEN AND PELVIS WITH CONTRAST TECHNIQUE: Multidetector CT imaging of the abdomen and pelvis was performed using the standard protocol following bolus administration of intravenous contrast. CONTRAST:  77mL OMNIPAQUE IOHEXOL 350 MG/ML SOLN COMPARISON:  None. FINDINGS: Lower chest: Mild atelectasis is seen within the posterior aspects of the bilateral lung bases. Very small bilateral pleural effusions are noted. Hepatobiliary: A 5 mm focus of parenchymal low attenuation is seen within the right lobe of the liver. This is too small to characterize by CT examination. No gallstones, gallbladder wall thickening, or biliary dilatation. Pancreas: Unremarkable. No pancreatic ductal dilatation or surrounding inflammatory changes. Spleen:  Normal in size without focal abnormality. Adrenals/Urinary Tract: Adrenal glands are unremarkable. Kidneys are normal in size, without focal lesions. Moderate severity bilateral hydronephrosis and hydroureter are noted, to the level of a markedly distended urinary bladder. Moderate to marked severity bilateral perinephric inflammatory fat stranding is also seen. There is mild inflammatory fat stranding surrounding the urinary bladder. Stomach/Bowel: Stomach is within normal limits. Appendix appears normal. No evidence of bowel wall thickening, distention, or inflammatory changes. Vascular/Lymphatic: Aortic atherosclerosis. No enlarged abdominal or pelvic lymph nodes. Reproductive: Moderate to marked severity prostate gland enlargement is seen Other: No abdominal wall hernia or abnormality. No abdominopelvic ascites. Musculoskeletal: Degenerative changes are seen within the mid and lower lumbar spine. IMPRESSION: 1. Markedly distended urinary bladder with subsequent bilateral hydronephrosis  and hydroureter, likely a result of urinary bladder outlet obstruction secondary to an enlarged prostate gland. 2. Additional findings consistent with mild cystitis. 3. While the perinephric inflammatory fat stranding is nonspecific in nature, sequelae associated with acute pyelonephritis cannot be excluded. Electronically Signed   By: Aram Candela M.D.   On: 07/26/2021 00:40   DG Chest Port 1 View  Result Date: 07/25/2021 CLINICAL DATA:  Sepsis EXAM: PORTABLE CHEST 1 VIEW COMPARISON:  None. FINDINGS: The heart size and mediastinal contours are within normal limits. Both lungs are clear. The visualized skeletal structures are unremarkable. IMPRESSION: No active disease. Electronically Signed   By: Deatra Robinson M.D.   On: 07/25/2021 21:13        Scheduled Meds:  aspirin EC  81 mg Oral Daily   Chlorhexidine Gluconate Cloth  6 each Topical Daily   insulin aspart  0-15 Units Subcutaneous TID AC & HS   simvastatin   40 mg Oral Daily   tamsulosin  0.4 mg Oral Daily   Continuous Infusions:  cefTRIAXone (ROCEPHIN)  IV 1 g (07/26/21 2106)     LOS: 1 day    Time spent:    Zannie Cove, MD [facility]   07/27/2021, 2:47 PM

## 2021-07-28 LAB — BASIC METABOLIC PANEL
Anion gap: 5 (ref 5–15)
BUN: 12 mg/dL (ref 8–23)
CO2: 25 mmol/L (ref 22–32)
Calcium: 8.2 mg/dL — ABNORMAL LOW (ref 8.9–10.3)
Chloride: 106 mmol/L (ref 98–111)
Creatinine, Ser: 1.12 mg/dL (ref 0.61–1.24)
GFR, Estimated: >60 mL/min (ref >60)
Glucose, Bld: 114 mg/dL — ABNORMAL HIGH (ref 70–99)
Potassium: 3.9 mmol/L (ref 3.5–5.1)
Sodium: 136 mmol/L (ref 135–145)

## 2021-07-28 LAB — GLUCOSE, CAPILLARY
Glucose-Capillary: 164 mg/dL — ABNORMAL HIGH (ref 70–99)
Glucose-Capillary: 120 mg/dL — ABNORMAL HIGH (ref 70–99)

## 2021-07-28 LAB — CBC
HCT: 34.8 % — ABNORMAL LOW (ref 39.0–52.0)
Hemoglobin: 11.1 g/dL — ABNORMAL LOW (ref 13.0–17.0)
MCH: 27.1 pg (ref 26.0–34.0)
MCHC: 31.9 g/dL (ref 30.0–36.0)
MCV: 84.9 fL (ref 80.0–100.0)
Platelets: 138 10*3/uL — ABNORMAL LOW (ref 150–400)
RBC: 4.1 MIL/uL — ABNORMAL LOW (ref 4.22–5.81)
RDW: 14.3 % (ref 11.5–15.5)
WBC: 6 10*3/uL (ref 4.0–10.5)
nRBC: 0 % (ref 0.0–0.2)

## 2021-07-28 LAB — URINE CULTURE
Culture: NO GROWTH
Special Requests: NORMAL

## 2021-07-28 MED ORDER — DIAZEPAM 10 MG PO TABS: 5.0000 mg | ORAL_TABLET | Freq: Every evening | ORAL | 0 refills | Status: AC | PRN

## 2021-07-28 MED ORDER — CEFDINIR 300 MG PO CAPS: 300.0000 mg | ORAL_CAPSULE | Freq: Two times a day (BID) | ORAL | 0 refills | Status: AC

## 2021-07-28 MED ORDER — CEFDINIR 300 MG PO CAPS
300.0000 mg | ORAL_CAPSULE | Freq: Two times a day (BID) | ORAL | Status: DC
  Administered 2021-07-28: 300 mg via ORAL
  Filled 2021-07-28: qty 1

## 2021-07-28 MED ORDER — TAMSULOSIN HCL 0.4 MG PO CAPS: 0.4000 mg | ORAL_CAPSULE | Freq: Every day | ORAL | 0 refills | Status: AC

## 2021-07-28 NOTE — Evaluation (Signed)
Physical Therapy Evaluation Patient Details Name: Blayden Conwell MRN: [id number] DOB: 1951-02-16 Today's Date: 07/28/2021   History of Present Illness  Pt is a 70 year old male admitted 8/16 with dx of pyelonephritis. He presented to the ED via EMS from home with a several day history of progressively worsening abdominal discomfort and confusion. PMH consists of diabetes mellitus type 2, hypertension, hyperlipidemia, and benign prostatic hyperplasia.   Clinical Impression  Pt admitted with above diagnosis. PTA pt lived alone, active and independent. On eval, pt required supervision transfers, and min guard assist ambulation 200' without AD. HR in 120s with activity. SpO2 in 90s on RA. Pt currently with functional limitations due to the deficits listed below (see PT Problem List). Pt will benefit from skilled PT to increase their independence and safety with mobility to allow discharge home. PT to follow acutely. No follow up services indicated.       Follow Up Recommendations No PT follow up;Supervision - Intermittent    Equipment Recommendations  None recommended by PT    Recommendations for Other Services       Precautions / Restrictions Precautions Precautions: Fall      Mobility  Bed Mobility Overal bed mobility: Modified Independent                  Transfers Overall transfer level: Needs assistance Equipment used: None Transfers: Sit to/from [employer] Transfers Sit to Stand: Supervision Stand pivot transfers: Supervision       General transfer comment: supervision for safety  Ambulation/Gait Ambulation/Gait assistance: Min guard Gait Distance (Feet): 200 Feet Assistive device: None Gait Pattern/deviations: WFL(Within Functional Limits) Gait velocity: mildly decreased Gait velocity interpretation: >2.62 ft/sec, indicative of community ambulatory General Gait Details: mildly unsteady, min guard for safety. HR in 120s with activity. Resting HR in  100s  Stairs            Wheelchair Mobility    Modified Rankin (Stroke Patients Only)       Balance Overall balance assessment: Needs assistance Sitting-balance support: No upper extremity supported;Feet supported Sitting balance-Leahy Scale: Good     Standing balance support: No upper extremity supported;During functional activity Standing balance-Leahy Scale: Fair Standing balance comment: mildly unsteady but no overt LOB                             Pertinent Vitals/Pain Pain Assessment: No/denies pain    Home Living Family/patient expects to be discharged to:: Private residence Living Arrangements: Alone Available Help at Discharge: Family;Available PRN/intermittently Type of Home: House Home Access: Stairs to enter   Entrance Stairs-Number of Steps: 2 Home Layout: Multi-level;Able to live on main level with bedroom/bathroom Home Equipment: Dan Humphreys - 2 wheels;Cane - single point      Prior Function Level of Independence: Independent         Comments: Active. Walks at [employer] for exercise. Drives. Grocery shops.     Hand Dominance        Extremity/Trunk Assessment   Upper Extremity Assessment Upper Extremity Assessment: Overall WFL for tasks assessed    Lower Extremity Assessment Lower Extremity Assessment: Overall WFL for tasks assessed    Cervical / Trunk Assessment Cervical / Trunk Assessment: Normal  Communication   Communication: No difficulties  Cognition Arousal/Alertness: Awake/alert Behavior During Therapy: WFL for tasks assessed/performed Overall Cognitive Status: No family/caregiver present to determine baseline cognitive functioning Area of Impairment: Orientation;Safety/judgement  Orientation Level: Disoriented to;Time (Originally stated it was 5. Able to correct on second attempt to 2022.)       Safety/Judgement: Decreased awareness of safety     General Comments: decreased  awareness of medical status      General Comments General comments (skin integrity, edema, etc.): Resting HR in 100s. HR in 120s with activity. SpO2 in 90s on RA.    Exercises     Assessment/Plan    PT Assessment Patient needs continued PT services  PT Problem List Decreased mobility;Decreased safety awareness;Decreased balance;Decreased activity tolerance       PT Treatment Interventions Therapeutic activities;Gait training;Patient/family education;Therapeutic exercise;Balance training;Stair training;Functional mobility training    PT Goals (Current goals can be found in the Care Plan section)  Acute Rehab PT Goals Patient Stated Goal: home ASAP PT Goal Formulation: With patient Time For Goal Achievement: 08/11/21 Potential to Achieve Goals: Good    Frequency Min 3X/week   Barriers to discharge        Co-evaluation               AM-PAC PT "6 Clicks" Mobility  Outcome Measure Help needed turning from your back to your side while in a flat bed without using bedrails?: None Help needed moving from lying on your back to sitting on the side of a flat bed without using bedrails?: None Help needed moving to and from a bed to a chair (including a wheelchair)?: A Little Help needed standing up from a chair using your arms (e.g., wheelchair or bedside chair)?: A Little Help needed to walk in hospital room?: A Little Help needed climbing 3-5 steps with a railing? : A Little 6 Click Score: 20    End of Session Equipment Utilized During Treatment: Gait belt Activity Tolerance: Patient tolerated treatment well Patient left: in chair;with chair alarm set;with call bell/phone within reach Nurse Communication: Mobility status PT Visit Diagnosis: Unsteadiness on feet (R26.81)    Time: 8676-7209 PT Time Calculation (min) (ACUTE ONLY): 25 min   Charges:   PT Evaluation $PT Eval Moderate Complexity: 1 Mod PT Treatments $Gait Training: 8-22 mins        Aida Raider, PT   Office # [phone] Pager [phone]   Ilda Foil 07/28/2021, 10:33 AM

## 2021-07-28 NOTE — TOC Progression Note (Signed)
Transition of Care [facility]) - Progression Note    Patient Details  Name: Matthew Garza MRN: [id number] Date of Birth: 21-Mar-1951  [facility] CM/SW Contact  Huston Foley Jacklynn Ganong, RN Phone Number: 07/28/2021, 4:38 PM  Clinical Narrative:  Patient is 70 yr old male adm an d treated for pyelonephritis, will discharge home with foley catheter . CM contacted to arrange for [facility]. Spoke with patient's daughter, [name], referral was called to [facility] Liaison. Patient lives independently with family checking on him. Give  He requests that his cell phone be called to arrange time for nurse visits. CM will provide [facility] with the number [phone].   Expected Discharge Plan: Home w Home Health Services Barriers to Discharge: No Barriers Identified  Expected Discharge Plan and Services Expected Discharge Plan: Home w Home Health Services In-house Referral: NA Discharge Planning Services: CM Consult Post Acute Care Choice: Home Health Living arrangements for the past 2 months: Single Family Home Expected Discharge Date: 07/28/21               DME Arranged: N/A DME Agency: NA       HH Arranged: RN HH Agency: [facility] Date [facility] Agency Contacted: 07/28/21 Time HH Agency Contacted: 1630 Representative spoke with at [facility] Agency: Lorenza Chick   Social Determinants of Health (SDOH) Interventions    Readmission Risk Interventions No flowsheet data found.

## 2021-07-28 NOTE — Plan of Care (Signed)
  Problem: Safety: Goal: Non-violent Restraint(s) Outcome: Adequate for Discharge   Problem: Education: Goal: Knowledge of General Education information will improve Description: Including pain rating scale, medication(s)/side effects and non-pharmacologic comfort measures Outcome: Adequate for Discharge   Problem: Health Behavior/Discharge Planning: Goal: Ability to manage health-related needs will improve Outcome: Adequate for Discharge   Problem: Clinical Measurements: Goal: Ability to maintain clinical measurements within normal limits will improve Outcome: Adequate for Discharge Goal: Will remain free from infection Outcome: Adequate for Discharge Goal: Diagnostic test results will improve Outcome: Adequate for Discharge Goal: Respiratory complications will improve Outcome: Adequate for Discharge Goal: Cardiovascular complication will be avoided Outcome: Adequate for Discharge   Problem: Activity: Goal: Risk for activity intolerance will decrease Outcome: Adequate for Discharge   Problem: Nutrition: Goal: Adequate nutrition will be maintained Outcome: Adequate for Discharge   Problem: Coping: Goal: Level of anxiety will decrease Outcome: Adequate for Discharge   Problem: Elimination: Goal: Will not experience complications related to bowel motility Outcome: Adequate for Discharge Goal: Will not experience complications related to urinary retention Outcome: Adequate for Discharge   Problem: Pain Managment: Goal: General experience of comfort will improve Outcome: Adequate for Discharge   Problem: Safety: Goal: Ability to remain free from injury will improve Outcome: Adequate for Discharge   Problem: Skin Integrity: Goal: Risk for impaired skin integrity will decrease Outcome: Adequate for Discharge   Problem: Education: Goal: Knowledge of risk factors and measures for prevention of condition will improve Outcome: Adequate for Discharge   Problem:  Coping: Goal: Psychosocial and spiritual needs will be supported Outcome: Adequate for Discharge   Problem: Respiratory: Goal: Will maintain a patent airway Outcome: Adequate for Discharge Goal: Complications related to the disease process, condition or treatment will be avoided or minimized Outcome: Adequate for Discharge

## 2021-07-28 NOTE — Plan of Care (Signed)
  Problem: Education: Goal: Knowledge of General Education information will improve Description Including pain rating scale, medication(s)/side effects and non-pharmacologic comfort measures Outcome: Progressing   Problem: Health Behavior/Discharge Planning: Goal: Ability to manage health-related needs will improve Outcome: Progressing   

## 2021-07-28 NOTE — Care Management Important Message (Signed)
Important Message  Patient Details  Name: Matthew Garza MRN: [id number] Date of Birth: 1951/04/10   Medicare Important Message Given:  Yes     Kaleyah Labreck Stefan Church 07/28/2021, 3:25 PM

## 2021-07-28 NOTE — Evaluation (Signed)
Occupational Therapy Evaluation Patient Details Name: Matthew Garza MRN: [id number] DOB: [birth year] Today's Date: 07/28/2021    History of Present Illness Pt is a 70 year old male admitted 8/16 with dx of pyelonephritis. He presented to the ED via EMS from home with a several day history of progressively worsening abdominal discomfort and confusion. PMH consists of diabetes mellitus type 2, hypertension, hyperlipidemia, and benign prostatic hyperplasia.   Clinical Impression   Pt was independent and active prior to admission. Mild unsteadiness with ambulation, requiring min guard assist for ambulation. Pt demonstrating decreased awareness and safety and lines with impulsivity noted. He requires set up to min guard assist for ADL. Will follow acutely, do not anticipate pt will need post acute OT.     Follow Up Recommendations  No OT follow up    Equipment Recommendations  None recommended by OT    Recommendations for Other Services       Precautions / Restrictions Precautions Precautions: Fall      Mobility Bed Mobility Overal bed mobility: Modified Independent             General bed mobility comments: in chair    Transfers Overall transfer level: Needs assistance Equipment used: None Transfers: Sit to/from Stand Sit to Stand: Supervision Stand pivot transfers: Supervision       General transfer comment: stands abruptly without awareness of lines    Balance Overall balance assessment: Needs assistance Sitting-balance support: No upper extremity supported;Feet supported Sitting balance-Leahy Scale: Good Sitting balance - Comments: no LOB donning socks   Standing balance support: No upper extremity supported;During functional activity Standing balance-Leahy Scale: Fair Standing balance comment: mildly unsteady but no overt LOB                           ADL either performed or assessed with clinical judgement   ADL Overall ADL's : Needs  assistance/impaired Eating/Feeding: Independent;Sitting   Grooming: Oral care;Standing;Supervision/safety   Upper Body Bathing: Set up;Sitting   Lower Body Bathing: Supervison/ safety;Sit to/from stand   Upper Body Dressing : Set up;Sitting   Lower Body Dressing: Supervision/safety;Sit to/from stand   Toilet Transfer: Min guard;Ambulation;Regular Toilet   Toileting- Architect and Hygiene: Supervision/safety;Sit to/from stand       Functional mobility during ADLs: Min guard       Vision Patient Visual Report: No change from baseline       Perception     Praxis      Pertinent Vitals/Pain Pain Assessment: No/denies pain     Hand Dominance Right   Extremity/Trunk Assessment Upper Extremity Assessment Upper Extremity Assessment: Overall WFL for tasks assessed   Lower Extremity Assessment Lower Extremity Assessment: Defer to PT evaluation   Cervical / Trunk Assessment Cervical / Trunk Assessment: Normal   Communication Communication Communication: No difficulties   Cognition Arousal/Alertness: Awake/alert Behavior During Therapy: Impulsive Overall Cognitive Status: No family/caregiver present to determine baseline cognitive functioning Area of Impairment: Safety/judgement                 Orientation Level: Disoriented to;Time (Originally stated it was 65. Able to correct on second attempt to 2022.)       Safety/Judgement: Decreased awareness of safety     General Comments: decreased awareness of medical status   General Comments  Resting HR in 100s. HR in 120s with activity. SpO2 in 90s on RA.    Exercises     Shoulder Instructions  Home Living Family/patient expects to be discharged to:: Private residence Living Arrangements: Alone Available Help at Discharge: Family;Available PRN/intermittently Type of Home: House Home Access: Stairs to enter Entrance Stairs-Number of Steps: 2   Home Layout: Multi-level;Able to live  on main level with bedroom/bathroom     Bathroom Shower/Tub: Producer, television/film/video: Standard     Home Equipment: Environmental consultant - 2 wheels;Cane - single point;Bedside commode          Prior Functioning/Environment Level of Independence: Independent        Comments: Active. Walks at [employer] for exercise. Drives. Grocery shops.        OT Problem List: Impaired balance (sitting and/or standing);Decreased cognition;Decreased safety awareness      OT Treatment/Interventions:      OT Goals(Current goals can be found in the care plan section) Acute Rehab OT Goals Patient Stated Goal: home ASAP OT Goal Formulation: With patient Time For Goal Achievement: 08/10/21 Potential to Achieve Goals: Good  OT Frequency: Min 2X/week   Barriers to D/C:            Co-evaluation              AM-PAC OT "6 Clicks" Daily Activity     Outcome Measure Help from another person eating meals?: None Help from another person taking care of personal grooming?: A Little Help from another person toileting, which includes using toliet, bedpan, or urinal?: A Little Help from another person bathing (including washing, rinsing, drying)?: A Little Help from another person to put on and taking off regular upper body clothing?: None Help from another person to put on and taking off regular lower body clothing?: A Little 6 Click Score: 20   End of Session Equipment Utilized During Treatment: Gait belt  Activity Tolerance: Patient tolerated treatment well Patient left: in chair;with call bell/phone within reach;with chair alarm set  OT Visit Diagnosis: Unsteadiness on feet (R26.81);Other abnormalities of gait and mobility (R26.89);Other symptoms and signs involving cognitive function                Time: 6754-4920 OT Time Calculation (min): 24 min Charges:  OT General Charges $OT Visit: 1 Visit OT Evaluation $OT Eval Low Complexity: 1 Low OT Treatments $Self Care/Home Management :  8-22 mins  [name], OTR/L Acute Rehabilitation Services Pager: [phone] Office: [phone]  [name] 07/28/2021, 11:37 AM

## 2021-07-28 NOTE — Discharge Summary (Signed)
Physician Discharge Summary  Liberty Stead NUU:[id number] DOB: May 30, 1951 DOA: 07/25/2021  PCP: Pcp, No  Admit date: 07/25/2021 Discharge date: 07/28/2021  Time spent: 35 minutes  Recommendations for Outpatient Follow-up:  PCP in 1 week [facility] in 1 to 2 weeks for voiding trial Home health RN   Discharge Diagnoses:  Toxic encephalopathy UTI Urinary retention   Pyelonephritis Active Problems:   Type 2 diabetes mellitus without complication, without long-term current use of insulin (HCC)   Essential hypertension   Hyponatremia   Urinary retention due to benign prostatic hyperplasia   Mixed diabetic hyperlipidemia associated with type 2 diabetes mellitus (HCC)   Acute metabolic encephalopathy   Discharge Condition: Stable  Diet recommendation: Low-sodium  There were no vitals filed for this visit.  History of present illness:  Matthew Garza is a 70 year old male with history of type 2 diabetes mellitus hypertension, BPH, dyslipidemia presented to the ED with lower abdominal discomfort, urinary frequency and confusion.  Reportedly he was diagnosed with COVID last week after which he started to experience some fatigue, several days later started complaining of lower abdominal pain and dysuria.  24 hours prior to admission became extremely confused, lethargic with periods of agitation, EMS was called, he required restraints and was brought to the emergency room. In the ED he was noted to have lactic acidosis, hyponatremia, abnormal urinalysis, positive COVID PCR, CT abdomen pelvis noted markedly distended bladder with bilateral hydronephrosis and hydroureter secondary to bladder outlet obstruction from an enlarged prostate with bilateral perinephric stranding, could be secondary pyelonephritis. -Foley catheter was placed in the ER, 3 L of urine drained -Started on IV ceftriaxone and IV fluids    Hospital Course:   Toxic encephalopathy -Secondary to UTI, pyelonephritis from  bladder outlet obstruction -Foley catheter placed in the ED 8/17 morning -Treated with IV ceftriaxone, urine culture grew multiple species  -Clinically improved and almost back to baseline now, PT OT eval completed, no home health services recommended  -Transition to oral cefdinir for 3 more days -Discharged home in a stable condition, advised close follow-up with PCP in 1 week and [facility] in 1 to 2 weeks with voiding trial    BPH, urinary retention -Foley placed in the ED due to acute retention resulting in 3 L of urine -Started on Flomax -Discharged home with a Foley catheter, referral sent to [facility] -Voiding trial in 1 to 2 weeks   Positive COVID PCR -Relatively asymptomatic -Reportedly had a positive home antigen test on 8/5, has completed 10 days of inpatient isolation.   Type 2 diabetes mellitus -Metformin and Januvia resumed   Hyponatremia -Sodium improving   Hypertension -Cozaar resumed    Discharge Exam: Vitals:   07/28/21 0822 07/28/21 1201  BP: 114/70 125/75  Pulse: 100 83  Resp: 19 18  Temp: 97.8 F (36.6 C) 97.8 F (36.6 C)  SpO2: 91% 90%    General: AAOx3 Cardiovascular: S1S2/RRR Respiratory: CTAB  Discharge Instructions    Allergies as of 07/28/2021   No Known Allergies      Medication List     TAKE these medications    Aspirin 81 MG Caps Take 81 mg by mouth daily.   cefdinir 300 MG capsule Commonly known as: OMNICEF Take 1 capsule (300 mg total) by mouth every 12 (twelve) hours for 3 days.   diazepam 10 MG tablet Commonly known as: VALIUM Take 0.5 tablets (5 mg total) by mouth at bedtime as needed for sleep. What changed: how much to take  losartan 50 MG tablet Commonly known as: COZAAR Take 50 mg by mouth daily.   metFORMIN 1000 MG tablet Commonly known as: GLUCOPHAGE Take 1,000 mg by mouth daily with breakfast.   sildenafil 100 MG tablet Commonly known as: VIAGRA Take 100 mg by mouth daily as needed  for erectile dysfunction.   simvastatin 40 MG tablet Commonly known as: ZOCOR Take 40 mg by mouth daily.   sitaGLIPtin 100 MG tablet Commonly known as: JANUVIA Take 100 mg by mouth daily.   tamsulosin 0.4 MG Caps capsule Commonly known as: FLOMAX Take 1 capsule (0.4 mg total) by mouth daily. Start taking on: July 29, 2021       No Known Allergies    The results of significant diagnostics from this hospitalization (including imaging, microbiology, ancillary and laboratory) are listed below for reference.    Significant Diagnostic Studies: CT HEAD WO CONTRAST ( )  Result Date: 07/26/2021 CLINICAL DATA:  Delirium EXAM: CT HEAD WITHOUT CONTRAST TECHNIQUE: Contiguous axial images were obtained from the base of the skull through the vertex without intravenous contrast. COMPARISON:  None. FINDINGS: Brain: No evidence of acute infarction, hemorrhage, hydrocephalus, extra-axial collection or mass lesion/mass effect. Mild cortical atrophy. Mild subcortical white matter and periventricular small vessel ischemic changes. Vascular: No hyperdense vessel or unexpected calcification. Skull: Normal. Negative for fracture or focal lesion. Sinuses/Orbits: The visualized paranasal sinuses are essentially clear. The mastoid air cells are unopacified. Other: None. IMPRESSION: No evidence of acute intracranial abnormality. Atrophy with mild small vessel ischemic changes. Electronically Signed   By: Charline Bills M.D.   On: 07/26/2021 00:19   CT ABDOMEN PELVIS W CONTRAST  Result Date: 07/26/2021 CLINICAL DATA:  Abdominal pain. EXAM: CT ABDOMEN AND PELVIS WITH CONTRAST TECHNIQUE: Multidetector CT imaging of the abdomen and pelvis was performed using the standard protocol following bolus administration of intravenous contrast. CONTRAST:  67mL OMNIPAQUE IOHEXOL 350 MG/ML SOLN COMPARISON:  None. FINDINGS: Lower chest: Mild atelectasis is seen within the posterior aspects of the bilateral lung bases. Very  small bilateral pleural effusions are noted. Hepatobiliary: A 5 mm focus of parenchymal low attenuation is seen within the right lobe of the liver. This is too small to characterize by CT examination. No gallstones, gallbladder wall thickening, or biliary dilatation. Pancreas: Unremarkable. No pancreatic ductal dilatation or surrounding inflammatory changes. Spleen: Normal in size without focal abnormality. Adrenals/Urinary Tract: Adrenal glands are unremarkable. Kidneys are normal in size, without focal lesions. Moderate severity bilateral hydronephrosis and hydroureter are noted, to the level of a markedly distended urinary bladder. Moderate to marked severity bilateral perinephric inflammatory fat stranding is also seen. There is mild inflammatory fat stranding surrounding the urinary bladder. Stomach/Bowel: Stomach is within normal limits. Appendix appears normal. No evidence of bowel wall thickening, distention, or inflammatory changes. Vascular/Lymphatic: Aortic atherosclerosis. No enlarged abdominal or pelvic lymph nodes. Reproductive: Moderate to marked severity prostate gland enlargement is seen Other: No abdominal wall hernia or abnormality. No abdominopelvic ascites. Musculoskeletal: Degenerative changes are seen within the mid and lower lumbar spine. IMPRESSION: 1. Markedly distended urinary bladder with subsequent bilateral hydronephrosis and hydroureter, likely a result of urinary bladder outlet obstruction secondary to an enlarged prostate gland. 2. Additional findings consistent with mild cystitis. 3. While the perinephric inflammatory fat stranding is nonspecific in nature, sequelae associated with acute pyelonephritis cannot be excluded. Electronically Signed   By: Aram Candela M.D.   On: 07/26/2021 00:40   DG Chest Port 1 View  Result Date: 07/25/2021 CLINICAL DATA:  Sepsis EXAM: PORTABLE CHEST 1 VIEW COMPARISON:  None. FINDINGS: The heart size and mediastinal contours are within normal  limits. Both lungs are clear. The visualized skeletal structures are unremarkable. IMPRESSION: No active disease. Electronically Signed   By: Deatra Robinson M.D.   On: 07/25/2021 21:13    Microbiology: Recent Results (from the past 240 hour(s))  Blood culture (routine single)     Status: None (Preliminary result)   Collection Time: 07/25/21  7:08 PM   Specimen: BLOOD  Result Value Ref Range Status   Specimen Description BLOOD SITE NOT SPECIFIED  Final   Special Requests   Final    BOTTLES DRAWN AEROBIC AND ANAEROBIC Blood Culture adequate volume   Culture   Final    NO GROWTH 3 DAYS Performed at [facility], [address], [place], [place] [address]    Report Status PENDING  Incomplete  SARS CORONAVIRUS 2 (TAT 6-24 HRS) Nasopharyngeal Nasopharyngeal Swab     Status: Abnormal   Collection Time: 07/25/21  7:09 PM   Specimen: Nasopharyngeal Swab  Result Value Ref Range Status   SARS Coronavirus 2 POSITIVE (A) NEGATIVE Final    Comment: (NOTE) SARS-CoV-2 target nucleic acids are DETECTED.  The SARS-CoV-2 RNA is generally detectable in upper and lower respiratory specimens during the acute phase of infection. Positive results are indicative of the presence of SARS-CoV-2 RNA. Clinical correlation with patient history and other diagnostic information is  necessary to determine patient infection status. Positive results do not rule out bacterial infection or co-infection with other viruses.  The expected result is Negative.  Fact Sheet for Patients: HairSlick.no  Fact Sheet for Healthcare Providers: quierodirigir.com  This test is not yet approved or cleared by the [place] FDA and  has been authorized for detection and/or diagnosis of SARS-CoV-2 by FDA under an Emergency Use Authorization (EUA). This EUA will remain  in effect (meaning this test can be used) for the duration of the COVID-19 declaration under Section  564(b)(1) of the Act, 21 U. S.C. section 360bbb-3(b)(1), unless the authorization is terminated or revoked sooner.   Performed at [facility], [address], [place], [place] [address]   Urine Culture     Status: Abnormal   Collection Time: 07/25/21  9:34 PM   Specimen: In/Out Cath Urine  Result Value Ref Range Status   Specimen Description IN/OUT CATH URINE  Final   Special Requests   Final    NONE Performed at [facility], [address] [place], [place] [address]    Culture MULTIPLE SPECIES PRESENT, SUGGEST RECOLLECTION (A)  Final   Report Status 07/27/2021 FINAL  Final  Urine Culture     Status: None   Collection Time: 07/27/21  2:47 PM   Specimen: Urine, Catheterized  Result Value Ref Range Status   Specimen Description URINE, CATHETERIZED  Final   Special Requests Normal  Final   Culture   Final    NO GROWTH Performed at [facility], [address], [place], [place] [address]    Report Status 07/28/2021 FINAL  Final     Labs: Basic Metabolic Panel: Recent Labs  Lab 07/25/21 1908 07/25/21 1941 07/26/21 0513 07/28/21 0131  NA 127* 141 129* 136  K 3.7 2.3* 4.2 3.9  CL 93*  --  98 106  CO2 21*  --  24 25  GLUCOSE 155*  --  137* 114*  BUN 7*  --  6* 12  CREATININE  1.25*  --  1.27* 1.12  CALCIUM 8.8*  --  8.7* 8.2*  MG  --   --  1.6*  --    Liver Function Tests: Recent Labs  Lab 07/25/21 1908 07/26/21 0513  AST 19 19  ALT 12 13  ALKPHOS 49 41  BILITOT 1.2 1.2  PROT 7.1 6.4*  ALBUMIN 3.8 3.4*   No results for input(s): LIPASE, AMYLASE in the last 168 hours. Recent Labs  Lab 07/26/21 0221  AMMONIA 21   CBC: Recent Labs  Lab 07/25/21 1908 07/25/21 1941 07/26/21 0513 07/28/21 0131  WBC 6.2  --  9.1 6.0  NEUTROABS 4.7  --  7.4  --   HGB 12.8* 7.8* 12.3* 11.1*  HCT 38.8* 23.0* 36.4* 34.8*  MCV 83.3  --  83.1 84.9  PLT 174  --  165 138*   Cardiac Enzymes: No results for input(s): CKTOTAL, CKMB, CKMBINDEX, TROPONINI in  the last 168 hours. BNP: BNP (last 3 results) No results for input(s): BNP in the last 8760 hours.  ProBNP (last 3 results) No results for input(s): PROBNP in the last 8760 hours.  CBG: Recent Labs  Lab 07/27/21 1254 07/27/21 1553 07/27/21 2036 07/28/21 0824 07/28/21 1220  GLUCAP 149* 114* 136* 164* 120*       Signed:  Zannie CovePreetha Madelena Maturin MD.  [facility] 07/28/2021, 2:26 PM

## 2021-07-30 LAB — CULTURE, BLOOD (SINGLE)
Culture: NO GROWTH
Special Requests: ADEQUATE

## 2021-10-20 ENCOUNTER — Other Ambulatory Visit: Payer: Self-pay | Admitting: Urology

## 2021-11-16 ENCOUNTER — Other Ambulatory Visit: Payer: Self-pay

## 2021-11-16 ENCOUNTER — Encounter (HOSPITAL_BASED_OUTPATIENT_CLINIC_OR_DEPARTMENT_OTHER): Payer: Self-pay | Admitting: Urology

## 2021-11-16 NOTE — Progress Notes (Addendum)
Spoke w/ via phone for pre-op interview--- pt Lab needs dos----  [employer] results------ current ekg in epic/ chart COVID test -----patient states asymptomatic no test needed Arrive at ------- 1000 on 11-20-2021 NPO after MN NO Solid Food.  Clear liquids from MN until--- 0900 Med rec completed Medications to take morning of surgery ----- none Diabetic medication ----- pt takes metformin at night Patient instructed no nail polish to be worn day of surgery Patient instructed to bring photo id and insurance card day of surgery Patient aware to have Driver (ride ) / caregiver for 24 hours after surgery -- daughter, Mitzie Na Patient Special Instructions ----- reviewed [facility] and  visitor guidelines Pre-Op special Istructions ----- n/a Patient verbalized understanding of instructions that were given at this phone interview. Patient denies shortness of breath, chest pain, fever, cough at this phone interview.

## 2021-11-20 ENCOUNTER — Encounter (HOSPITAL_BASED_OUTPATIENT_CLINIC_OR_DEPARTMENT_OTHER): Payer: Self-pay | Admitting: Urology

## 2021-11-20 ENCOUNTER — Ambulatory Visit (HOSPITAL_BASED_OUTPATIENT_CLINIC_OR_DEPARTMENT_OTHER): Payer: Medicare PPO | Admitting: Certified Registered"

## 2021-11-20 ENCOUNTER — Encounter (HOSPITAL_BASED_OUTPATIENT_CLINIC_OR_DEPARTMENT_OTHER)
Admission: RE | Disposition: A | Discharge status: Home / Self Care | Payer: Self-pay | Source: Home / Self Care | Attending: Urology

## 2021-11-20 ENCOUNTER — Ambulatory Visit (HOSPITAL_BASED_OUTPATIENT_CLINIC_OR_DEPARTMENT_OTHER)
Admission: RE | Admit: 2021-11-20 | Discharge: 2021-11-21 | Disposition: A | Discharge status: Home / Self Care | Payer: Medicare PPO | Attending: Urology | Admitting: Urology

## 2021-11-20 ENCOUNTER — Other Ambulatory Visit: Payer: Self-pay

## 2021-11-20 DIAGNOSIS — Z87440 Personal history of urinary (tract) infections: Secondary | ICD-10-CM | POA: Insufficient documentation

## 2021-11-20 DIAGNOSIS — I10 Essential (primary) hypertension: Secondary | ICD-10-CM | POA: Insufficient documentation

## 2021-11-20 DIAGNOSIS — R338 Other retention of urine: Secondary | ICD-10-CM | POA: Insufficient documentation

## 2021-11-20 DIAGNOSIS — N400 Enlarged prostate without lower urinary tract symptoms: Secondary | ICD-10-CM | POA: Diagnosis present

## 2021-11-20 DIAGNOSIS — N138 Other obstructive and reflux uropathy: Secondary | ICD-10-CM | POA: Insufficient documentation

## 2021-11-20 DIAGNOSIS — N401 Enlarged prostate with lower urinary tract symptoms: Secondary | ICD-10-CM | POA: Diagnosis not present

## 2021-11-20 DIAGNOSIS — Z7982 Long term (current) use of aspirin: Secondary | ICD-10-CM | POA: Insufficient documentation

## 2021-11-20 DIAGNOSIS — E119 Type 2 diabetes mellitus without complications: Secondary | ICD-10-CM | POA: Diagnosis not present

## 2021-11-20 HISTORY — DX: Other obstructive and reflux uropathy: N13.8

## 2021-11-20 HISTORY — DX: Unspecified right bundle-branch block: I45.10

## 2021-11-20 HISTORY — DX: Personal history of other diseases of the digestive system: Z87.19

## 2021-11-20 HISTORY — PX: TRANSURETHRAL RESECTION OF PROSTATE: SHX73

## 2021-11-20 HISTORY — DX: Presence of other specified devices: Z97.8

## 2021-11-20 HISTORY — DX: Benign prostatic hyperplasia with lower urinary tract symptoms: N40.1

## 2021-11-20 LAB — POCT I-STAT, CHEM 8
BUN: 15 mg/dL (ref 8–23)
Calcium, Ion: 1.26 mmol/L (ref 1.15–1.40)
Chloride: 99 mmol/L (ref 98–111)
Creatinine, Ser: 1.2 mg/dL (ref 0.61–1.24)
Glucose, Bld: 138 mg/dL — ABNORMAL HIGH (ref 70–99)
HCT: 43 % (ref 39.0–52.0)
Hemoglobin: 14.6 g/dL (ref 13.0–17.0)
Potassium: 4.3 mmol/L (ref 3.5–5.1)
Sodium: 140 mmol/L (ref 135–145)
TCO2: 29 mmol/L (ref 22–32)

## 2021-11-20 LAB — GLUCOSE, CAPILLARY
Glucose-Capillary: 127 mg/dL — ABNORMAL HIGH (ref 70–99)
Glucose-Capillary: 167 mg/dL — ABNORMAL HIGH (ref 70–99)
Glucose-Capillary: 276 mg/dL — ABNORMAL HIGH (ref 70–99)

## 2021-11-20 LAB — HEMOGLOBIN A1C
Hgb A1c MFr Bld: 6.2 % — ABNORMAL HIGH (ref 4.8–5.6)
Mean Plasma Glucose: 131.24 mg/dL

## 2021-11-20 SURGERY — TURP (TRANSURETHRAL RESECTION OF PROSTATE)
Anesthesia: General | Site: Prostate

## 2021-11-20 MED ORDER — SODIUM CHLORIDE 0.9% FLUSH: 3.0000 mL | Freq: Two times a day (BID) | INTRAVENOUS | Status: DC

## 2021-11-20 MED ORDER — LIDOCAINE 2% (20 MG/ML) 5 ML SYRINGE
INTRAMUSCULAR | Status: AC
  Filled 2021-11-20: qty 5

## 2021-11-20 MED ORDER — DIPHENHYDRAMINE HCL 50 MG/ML IJ SOLN: 12.5000 mg | Freq: Four times a day (QID) | INTRAMUSCULAR | Status: DC | PRN

## 2021-11-20 MED ORDER — DIAZEPAM 5 MG PO TABS
5.0000 mg | ORAL_TABLET | Freq: Every evening | ORAL | Status: DC | PRN
  Administered 2021-11-20: 5 mg via ORAL

## 2021-11-20 MED ORDER — ONDANSETRON HCL 4 MG/2ML IJ SOLN
INTRAMUSCULAR | Status: AC
  Filled 2021-11-20: qty 2

## 2021-11-20 MED ORDER — OXYCODONE-ACETAMINOPHEN 5-325 MG PO TABS: 1.0000 | ORAL_TABLET | ORAL | Status: DC | PRN

## 2021-11-20 MED ORDER — ONDANSETRON HCL 4 MG/2ML IJ SOLN: 4.0000 mg | INTRAMUSCULAR | Status: DC | PRN

## 2021-11-20 MED ORDER — DIPHENHYDRAMINE HCL 12.5 MG/5ML PO ELIX: 12.5000 mg | ORAL_SOLUTION | Freq: Four times a day (QID) | ORAL | Status: DC | PRN

## 2021-11-20 MED ORDER — SODIUM CHLORIDE 0.9% FLUSH: 3.0000 mL | INTRAVENOUS | Status: DC | PRN

## 2021-11-20 MED ORDER — EPHEDRINE SULFATE-NACL 50-0.9 MG/10ML-% IV SOSY
PREFILLED_SYRINGE | INTRAVENOUS | Status: DC | PRN
  Administered 2021-11-20: 10 mg via INTRAVENOUS

## 2021-11-20 MED ORDER — INSULIN ASPART 100 UNIT/ML IJ SOLN
INTRAMUSCULAR | Status: AC
  Filled 2021-11-20: qty 1

## 2021-11-20 MED ORDER — OXYCODONE-ACETAMINOPHEN 5-325 MG PO TABS: 1.0000 | ORAL_TABLET | ORAL | 0 refills | Status: AC | PRN

## 2021-11-20 MED ORDER — PROPOFOL 10 MG/ML IV BOLUS
INTRAVENOUS | Status: AC
  Filled 2021-11-20: qty 20

## 2021-11-20 MED ORDER — EPHEDRINE 5 MG/ML INJ
INTRAVENOUS | Status: AC
  Filled 2021-11-20: qty 5

## 2021-11-20 MED ORDER — KCL IN DEXTROSE-NACL 20-5-0.45 MEQ/L-%-% IV SOLN
INTRAVENOUS | Status: DC
  Filled 2021-11-20: qty 1000

## 2021-11-20 MED ORDER — SODIUM CHLORIDE 0.9 % IR SOLN: 3000.0000 mL | Status: DC

## 2021-11-20 MED ORDER — INSULIN ASPART 100 UNIT/ML IJ SOLN
0.0000 [IU] | Freq: Three times a day (TID) | INTRAMUSCULAR | Status: DC
  Administered 2021-11-20 – 2021-11-21 (×2): 3 [IU] via SUBCUTANEOUS

## 2021-11-20 MED ORDER — FENTANYL CITRATE (PF) 100 MCG/2ML IJ SOLN
INTRAMUSCULAR | Status: DC | PRN
  Administered 2021-11-20: 50 ug via INTRAVENOUS
  Administered 2021-11-20 (×2): 25 ug via INTRAVENOUS

## 2021-11-20 MED ORDER — LACTATED RINGERS IV SOLN: INTRAVENOUS | Status: DC

## 2021-11-20 MED ORDER — CEFAZOLIN SODIUM-DEXTROSE 2-4 GM/100ML-% IV SOLN
2.0000 g | Freq: Once | INTRAVENOUS | Status: AC
  Administered 2021-11-20: 2 g via INTRAVENOUS

## 2021-11-20 MED ORDER — LIDOCAINE 2% (20 MG/ML) 5 ML SYRINGE
INTRAMUSCULAR | Status: DC | PRN
  Administered 2021-11-20: 100 mg via INTRAVENOUS

## 2021-11-20 MED ORDER — SIMVASTATIN 40 MG PO TABS
40.0000 mg | ORAL_TABLET | Freq: Every day | ORAL | Status: DC
  Administered 2021-11-20: 40 mg via ORAL
  Filled 2021-11-20: qty 1

## 2021-11-20 MED ORDER — LOSARTAN POTASSIUM 50 MG PO TABS
50.0000 mg | ORAL_TABLET | Freq: Every day | ORAL | Status: DC
  Administered 2021-11-20: 50 mg via ORAL
  Filled 2021-11-20: qty 1

## 2021-11-20 MED ORDER — PROPOFOL 10 MG/ML IV BOLUS
INTRAVENOUS | Status: DC | PRN
  Administered 2021-11-20: 160 mg via INTRAVENOUS

## 2021-11-20 MED ORDER — SODIUM CHLORIDE 0.9 % IR SOLN
Status: DC | PRN
  Administered 2021-11-20 (×3): 6000 mL
  Administered 2021-11-20: 1500 mL
  Administered 2021-11-20: 6000 mL

## 2021-11-20 MED ORDER — BISACODYL 10 MG RE SUPP: 10.0000 mg | Freq: Every day | RECTAL | Status: DC | PRN

## 2021-11-20 MED ORDER — OXYBUTYNIN CHLORIDE 5 MG PO TABS: 5.0000 mg | ORAL_TABLET | Freq: Three times a day (TID) | ORAL | Status: DC | PRN

## 2021-11-20 MED ORDER — DOCUSATE SODIUM 100 MG PO CAPS: 100.0000 mg | ORAL_CAPSULE | Freq: Every day | ORAL | 0 refills | Status: AC | PRN

## 2021-11-20 MED ORDER — POLYETHYLENE GLYCOL 3350 17 G PO PACK: 17.0000 g | PACK | Freq: Every day | ORAL | Status: DC | PRN

## 2021-11-20 MED ORDER — SODIUM CHLORIDE 0.9 % IV SOLN: 250.0000 mL | INTRAVENOUS | Status: DC | PRN

## 2021-11-20 MED ORDER — ONDANSETRON HCL 4 MG/2ML IJ SOLN
INTRAMUSCULAR | Status: DC | PRN
  Administered 2021-11-20: 4 mg via INTRAVENOUS

## 2021-11-20 MED ORDER — MORPHINE SULFATE (PF) 4 MG/ML IV SOLN: 2.0000 mg | INTRAVENOUS | Status: DC | PRN

## 2021-11-20 MED ORDER — INSULIN ASPART 100 UNIT/ML IJ SOLN
0.0000 [IU] | Freq: Every day | INTRAMUSCULAR | Status: DC
  Administered 2021-11-20: 3 [IU] via SUBCUTANEOUS

## 2021-11-20 MED ORDER — FENTANYL CITRATE (PF) 100 MCG/2ML IJ SOLN
25.0000 ug | INTRAMUSCULAR | Status: DC | PRN
Start: 2021-11-20 — End: 2021-11-21

## 2021-11-20 MED ORDER — FENTANYL CITRATE (PF) 100 MCG/2ML IJ SOLN
INTRAMUSCULAR | Status: AC
  Filled 2021-11-20: qty 2

## 2021-11-20 MED ORDER — DIAZEPAM 5 MG PO TABS
ORAL_TABLET | ORAL | Status: AC
  Filled 2021-11-20: qty 1

## 2021-11-20 MED ORDER — ZOLPIDEM TARTRATE 5 MG PO TABS: 5.0000 mg | ORAL_TABLET | Freq: Every evening | ORAL | Status: DC | PRN

## 2021-11-20 MED ORDER — DEXAMETHASONE SODIUM PHOSPHATE 10 MG/ML IJ SOLN
INTRAMUSCULAR | Status: AC
  Filled 2021-11-20: qty 1

## 2021-11-20 MED ORDER — DEXAMETHASONE SODIUM PHOSPHATE 10 MG/ML IJ SOLN
INTRAMUSCULAR | Status: DC | PRN
  Administered 2021-11-20: 5 mg via INTRAVENOUS

## 2021-11-20 MED ORDER — CEFAZOLIN SODIUM-DEXTROSE 2-4 GM/100ML-% IV SOLN
INTRAVENOUS | Status: AC
  Filled 2021-11-20: qty 100

## 2021-11-20 MED ORDER — DIPHENHYDRAMINE HCL 12.5 MG/5ML PO ELIX
ORAL_SOLUTION | ORAL | Status: AC
  Filled 2021-11-20: qty 5

## 2021-11-20 SURGICAL SUPPLY — 27 items
BAG DRAIN URO-CYSTO SKYTR STRL (DRAIN) ×2 IMPLANT
BAG DRN RND TRDRP ANRFLXCHMBR (UROLOGICAL SUPPLIES) ×1
BAG DRN UROCATH (DRAIN) ×1
BAG URINE DRAIN 2000ML AR STRL (UROLOGICAL SUPPLIES) ×2 IMPLANT
BAG URINE LEG 500ML (DRAIN) IMPLANT
CATH FOLEY 2WAY SLVR 30CC 20FR (CATHETERS) IMPLANT
CATH FOLEY 3WAY 30CC 22FR (CATHETERS) ×2 IMPLANT
CLOTH BEACON ORANGE TIMEOUT ST (SAFETY) ×2 IMPLANT
ELECT BIVAP BIPO 22/24 DONUT (ELECTROSURGICAL)
ELECT REM PT RETURN 9FT ADLT (ELECTROSURGICAL)
ELECTRD BIVAP BIPO 22/24 DONUT (ELECTROSURGICAL) IMPLANT
ELECTRODE REM PT RTRN 9FT ADLT (ELECTROSURGICAL) IMPLANT
GLOVE SURG ENC MOIS LTX SZ7 (GLOVE) ×2 IMPLANT
GOWN STRL REUS W/TWL LRG LVL3 (GOWN DISPOSABLE) ×2 IMPLANT
HOLDER FOLEY CATH W/STRAP (MISCELLANEOUS) ×2 IMPLANT
IV NS IRRIG 3000ML ARTHROMATIC (IV SOLUTION) ×14 IMPLANT
KIT TURNOVER CYSTO (KITS) ×2 IMPLANT
LOOP CUT BIPOLAR 24F LRG (ELECTROSURGICAL) ×2 IMPLANT
MANIFOLD NEPTUNE II (INSTRUMENTS) ×2 IMPLANT
NS IRRIG 500ML POUR BTL (IV SOLUTION) ×1 IMPLANT
PACK CYSTO (CUSTOM PROCEDURE TRAY) ×2 IMPLANT
SYR 30ML LL (SYRINGE) ×2 IMPLANT
SYR TOOMEY IRRIG 70ML (MISCELLANEOUS) ×2
SYRINGE TOOMEY IRRIG 70ML (MISCELLANEOUS) ×1 IMPLANT
TUBE CONNECTING 12X1/4 (SUCTIONS) ×2 IMPLANT
TUBING UROLOGY SET (TUBING) ×2 IMPLANT
WATER STERILE IRR 500ML POUR (IV SOLUTION) ×1 IMPLANT

## 2021-11-20 NOTE — Anesthesia Postprocedure Evaluation (Signed)
Anesthesia Post Note  Patient: Matthew Garza  Procedure(s) Performed: TRANSURETHRAL RESECTION OF THE PROSTATE (TURP), BIPOLAR (Prostate)     Anesthesia Post Evaluation No notable events documented.  Last Vitals:  Vitals:   11/20/21 1415 11/20/21 1445  BP: 129/66 (!) 145/73  Pulse: 65 65  Resp: 12 14  Temp:  36.4 C  SpO2: 96% 99%    Last Pain:  Vitals:   11/20/21 1543  TempSrc:   PainSc: 0-No pain                 Kymberli Wiegand

## 2021-11-20 NOTE — Discharge Instructions (Signed)

## 2021-11-20 NOTE — Transfer of Care (Signed)
Immediate Anesthesia Transfer of Care Note  Patient: Matthew Garza  Procedure(s) Performed: Procedure(s) (LRB): TRANSURETHRAL RESECTION OF THE PROSTATE (TURP), BIPOLAR (N/A)  Patient Location: PACU  Anesthesia Type: General  Level of Consciousness: awake, oriented, sedated and patient cooperative  Airway & Oxygen Therapy: Patient Spontanous Breathing and Patient connected to face mask oxygen  Post-op Assessment: Report given to PACU RN and Post -op Vital signs reviewed and stable  Post vital signs: Reviewed and stable  Complications: No apparent anesthesia complications Last Vitals:  Vitals Value Taken Time  BP 135/79 11/20/21 1330  Temp    Pulse 84 11/20/21 1331  Resp 13 11/20/21 1331  SpO2 98 % 11/20/21 1331  Vitals shown include unvalidated device data.  Last Pain:  Vitals:   11/20/21 1047  TempSrc: Oral  PainSc: 0-No pain      Patients Stated Pain Goal: 5 (11/20/21 1047)  Complications: No notable events documented.

## 2021-11-20 NOTE — OR Nursing (Signed)
Patients foley catheter removed in [facility] OR #2 by Mikael Spray RNC

## 2021-11-20 NOTE — Anesthesia Postprocedure Evaluation (Signed)
Anesthesia Post Note  Patient: Matthew Garza  Procedure(s) Performed: TRANSURETHRAL RESECTION OF THE PROSTATE (TURP), BIPOLAR (Prostate)     Patient location during evaluation: PACU Anesthesia Type: General Level of consciousness: awake Pain management: pain level controlled Vital Signs Assessment: post-procedure vital signs reviewed and stable Respiratory status: spontaneous breathing Postop Assessment: no apparent nausea or vomiting Anesthetic complications: no   No notable events documented.  Last Vitals:  Vitals:   11/20/21 1415 11/20/21 1445  BP: 129/66 (!) 145/73  Pulse: 65 65  Resp: 12 14  Temp:  36.4 C  SpO2: 96% 99%    Last Pain:  Vitals:   11/20/21 1543  TempSrc:   PainSc: 0-No pain                 Merelin Human

## 2021-11-20 NOTE — Op Note (Signed)
Operative Note  Preoperative diagnosis:  1.  BPH with bladder outlet obstruction  Postoperative diagnosis: 1.  BPH with bladder outlet obstruction  Procedure(s): 1.  Bipolar transurethral resection of prostate  Surgeon: Jettie Pagan, MD  Assistants:  None  Anesthesia:  General  Complications:  None  EBL:  Minimal  Specimens: 1. Prostate chips ID Type Source Tests Collected by Time Destination  1 : prostrate chips Tissue PATH GU resection / TURBT / partial nephrectomy SURGICAL PATHOLOGY Jannifer Hick, MD 11/20/2021 1145    Drains/Catheters: 1.  22Fr 3 way catheter with 72ml water into balloon  Intraoperative findings:   Trilobar obstructing prostate with elongated prostate urethra. Resected median and lateral lobes to level of capsule. Wide open prostatic urethra. Excellent hemsotasis.  Indication:  Matthew Garza is a 70 y.o. male with BPH with bladder outlet obstruction presenting for transurethral resection of the prostate. He had a prostate volume of 61cc. He was in urinary retention.  After thorough discussion including all relevant risk benefits and alternatives, he presents today for a bipolar TURP.  Description of procedure: The indication, alternatives, benefits and risks were discussed with the patient and informed consent was obtained.  Patient was brought to the operating room table, positioned supine, secured with a safety strap.  Pneumatic compression devices were placed on the lower extremities.  After the administration of intravenous antibiotics and general anesthesia, the patient was repositioned into the dorsal lithotomy position.  All pressure points were carefully padded.  A rectal examination was performed confirming a smooth symmetric enlarged gland.  The genitalia were prepped and draped in standard sterile manner.  A timeout was completed, verifying the correct patient, surgical procedure and positioning prior to beginning the procedure.  Isotonic sodium  chloride was used for irrigation.  A 26 French continuous-flow resectoscope sheath with the visual obturator and a 30 degree lens was advanced under direct vision into the bladder.  The anterior urethra appeared normal in its entirety.The prostatic urethra was elongated with trilobar hyperplasia.  On cystoscopic evaluation, his bladder capacity appeared normal, the bladder wall was noted to expand symmetrically in all dimensions.  There were no tumors, stones or foreign bodies present. The bladder was trabeculated with normal-appearing mucosa.  Both ureteral orifices were in their normal anatomic positions with clear urinary reflux noted bilaterally.  The obturator was removed and replaced by the working element with a resection loop.  The location of the ureteral orifices and the prostatic configuration were again confirmed.  Starting at the bladder neck and proceeding distally to the verumontanum a transurethral section of the prostate was performed using bipolar using energy of 4 and 5 for cutting and coagulation, respectively.  The procedure began at the bladder neck at the 5 o'clock and 7 o'clock positions and carefully carried distally to the verumontanum, resecting the intervening prostatic adenoma.  Next the left lateral lobe was resected to the level of the transverse capsular fibers.  The identical procedure was performed on the right lobe.  Attention was then directed anteriorly and the resection was completed from the 10 o'clock to 2 o'clock positions.  All bleeding vessels were fulgurated achieving meticulous hemostasis.  The bladder was irrigated with a Toomey syringe, ensuring removal of all prostate chips which were sent to pathology for evaluation.  Having completed the resection and the chips removed, we again confirmed hemostasis with the loop with coagulating current.  Upon completion of the entire procedure, the bladder and posterior urethra were reexamined, confirming open prostatic  urethra and bladder neck without evidence of bleeding or perforation.  Both ureteral orifices and the external sphincter were noted to be intact.  The resectoscope was withdrawn under direct vision and a 22 French three-way Foley catheter with a 30 cc balloon was inserted into the bladder.  The balloon was inflated with 50 cc of sterile water.  After multiple manual irrigations ensuring clear return of the irrigant, the procedure was terminated.  The catheter was attached to a drainage bag and continuous bladder irrigation was started with normal saline.  The patient was positioned supine.  At the end of the procedure, all counts were correct.  Patient tolerated the procedure well and was taken to the recovery room satisfactory condition.  Plan: Continuous bladder irrigation overnight with gentle Foley traction.  Plan to discharge home tomorrow with Foley catheter in place and void trial in the office in 3 days.  Matthew R. [place] Stell MD [facility]  Pager: [phone]

## 2021-11-20 NOTE — Anesthesia Procedure Notes (Signed)
Procedure Name: LMA Insertion Date/Time: 11/20/2021 12:10 PM Performed by: Francie Massing, CRNA Pre-anesthesia Checklist: Patient identified, Emergency Drugs available, Suction available and Patient being monitored Patient Re-evaluated:Patient Re-evaluated prior to induction Oxygen Delivery Method: Circle system utilized Preoxygenation: Pre-oxygenation with 100% oxygen Induction Type: IV induction Ventilation: Mask ventilation without difficulty LMA: LMA inserted LMA Size: 4.0 Number of attempts: 1 Airway Equipment and Method: Bite block Placement Confirmation: positive ETCO2 Tube secured with: Tape Dental Injury: Teeth and Oropharynx as per pre-operative assessment

## 2021-11-20 NOTE — Anesthesia Preprocedure Evaluation (Addendum)
Anesthesia Evaluation  Patient identified by MRN, date of birth, ID band Patient awake    Reviewed: Allergy & Precautions, NPO status , Patient's Chart, lab work & pertinent test results  Airway Mallampati: II  TM Distance: >3 FB     Dental   Pulmonary neg pulmonary ROS,    breath sounds clear to auscultation       Cardiovascular hypertension, + dysrhythmias  Rhythm:Regular Rate:Normal     Neuro/Psych    GI/Hepatic negative GI ROS, Neg liver ROS,   Endo/Other  diabetes  Renal/GU      Musculoskeletal   Abdominal   Peds  Hematology   Anesthesia Other Findings   Reproductive/Obstetrics                           Anesthesia Physical Anesthesia Plan  ASA: 3  Anesthesia Plan: General   Post-op Pain Management:    Induction: Intravenous  PONV Risk Score and Plan: 2 and Ondansetron and Midazolam  Airway Management Planned: LMA and Oral ETT  Additional Equipment:   Intra-op Plan:   Post-operative Plan: Extubation in OR  Informed Consent: I have reviewed the patients History and Physical, chart, labs and discussed the procedure including the risks, benefits and alternatives for the proposed anesthesia with the patient or authorized representative who has indicated his/her understanding and acceptance.     Dental advisory given  Plan Discussed with: CRNA and Anesthesiologist  Anesthesia Plan Comments:         Anesthesia Quick Evaluation

## 2021-11-20 NOTE — H&P (Signed)
CC/HPI: Matthew Garza is a 70 year old male seen in followup today for history of BPH/LUTS and recent urinary tract infection with urinary retention.   1. BPH/LUTS/urinary retention:  -He was hospitalized for acute urinary retention with bilateral hydronephrosis and UTI/pyelonephritis in 07/2021. Urine culture only resulted mixed bacterial growth.  -CT A/P 07/26/2021 demonstrated markedly distended bladder with bilateral hydronephrosis with enlarged prostate measuring 4.8 x 5.7 x 4.3 cm according to a roughly 61 g prostate.  -He passed a void trial on 08/15/2021. He returned on 09/14/2021 with no complaints of lower urinary tract symptoms. His IPSS score was reported at 1 with only mild intermittency. He denied sensation incomplete bladder emptying. He reports that he has a normal flow of stream. PVR was gerater than . He refused foley catheter.  -He returned on 10/11/2021 with renal bladder ultrasound demonstrating a 1.5 urine in his bladder and no hydronephrosis. He again reported no abdominal pain or flank pain. He states he has been voiding with a normal flow stream.  -He is now amenable to Foley catheter placement and TURP.   #2. Prostate cancer screening: He denies a family history of prostate cancer. He states that he has had prior PSA evaluations with have been in the normal range. Those are unavailable for my review. PSA on 09/14/2021 was elevated at 4.99. This is in the setting of enlarged prostate as well as recent urinary retention and infection. He understands risk of prostate cancer and does not wish to proceed with biopsy prior to scheduling TURP.   He denies cardiac or pulmonary history. He does take aspirin 81 mg daily.   Patient currently denies fever, chills, sweats, nausea, vomiting, abdominal or flank pain, gross hematuria or dysuria.     ALLERGIES: None   MEDICATIONS: Simvastatin 40 mg tablet  Tamsulosin Hcl 0.4 mg capsule 1 capsule PO Daily  Tamsulosin Hcl 0.4 mg capsule   Aspirin Ec 81 mg tablet, delayed release  Ibuprofen 200 mg tablet  Losartan Potassium 50 mg tablet  Metformin Er Gastric 500 mg tablet, er gastric retention 24 hr  Sildenafil Citrate 100 mg tablet     GU PSH: None   NON-GU PSH: Cataract surgery - 2016     GU PMH: BPH w/LUTS - 09/14/2021 Incomplete bladder emptying - 09/14/2021 Urinary Retention - 09/14/2021, - 08/15/2021 Hydronephrosis - 08/15/2021    NON-GU PMH: Anxiety Diabetes Type 2 Hypercholesterolemia Hypertension    FAMILY HISTORY: 1 Daughter - Other Death In The Family Father - Other Death In The Family Mother - Other   SOCIAL HISTORY: Marital Status: Unknown Preferred Language: English; Ethnicity: Not Hispanic Or Latino; Race: White Current Smoking Status: Patient has never smoked.   Tobacco Use Assessment Completed: Used Tobacco in last 30 days? Does not use smokeless tobacco. Drinks 1 drink per week.  Does not use drugs. Drinks 4+ caffeinated drinks per day.    REVIEW OF SYSTEMS:    GU Review Male:   Patient denies frequent urination, hard to postpone urination, burning/ pain with urination, get up at night to urinate, leakage of urine, stream starts and stops, trouble starting your stream, have to strain to urinate , erection problems, and penile pain.  Gastrointestinal (Upper):   Patient denies nausea, vomiting, and indigestion/ heartburn.  Gastrointestinal (Lower):   Patient denies diarrhea and constipation.  Constitutional:   Patient denies fever, night sweats, weight loss, and fatigue.  Skin:   Patient denies skin rash/ lesion and itching.  Eyes:   Patient denies blurred vision and  double vision.  Ears/ Nose/ Throat:   Patient denies sore throat and sinus problems.  Hematologic/Lymphatic:   Patient denies swollen glands and easy bruising.  Cardiovascular:   Patient denies leg swelling and chest pains.  Respiratory:   Patient denies cough and shortness of breath.  Endocrine:   Patient denies excessive  thirst.  Musculoskeletal:   Patient denies back pain and joint pain.  Neurological:   Patient denies headaches and dizziness.  Psychologic:   Patient denies anxiety and depression.   VITAL SIGNS: None   MULTI-SYSTEM PHYSICAL EXAMINATION:    Constitutional: Well-nourished. No physical deformities. Normally developed. Good grooming.  Respiratory: No labored breathing, no use of accessory muscles.   Cardiovascular: Normal temperature, normal extremity pulses, no swelling, no varicosities.  Gastrointestinal: No mass, no tenderness, no rigidity, non obese abdomen.     Complexity of Data:  Source Of History:  Patient, Medical Record Summary  Lab Test Review:   PSA  Records Review:   AUA Symptom Score, Previous Doctor Records, Previous Hospital Records  Urine Test Review:   Urinalysis  Urodynamics Review:   Review Bladder Scan  X-Ray Review: Renal Ultrasound: Reviewed Films. Reviewed Report. Discussed With Patient.     09/14/21  PSA  Total PSA 4.99 ng/mL    PROCEDURES:         Flexible Cystoscopy - 52000  Risks, benefits, and some of the potential complications of the procedure were discussed at length with the patient including infection, bleeding, voiding discomfort, urinary retention, fever, chills, sepsis, and others. All questions were answered. Informed consent was obtained. Antibiotic prophylaxis was given. Sterile technique and intraurethral analgesia were used.  Meatus:  Normal size. Normal location. Normal condition.  Urethra:  No strictures.  External Sphincter:  Normal.  Verumontanum:  Normal.  Prostate:  Obstructing. Severe hyperplasia.  Bladder Neck:  Non-obstructing.  Ureteral Orifices:  Normal location. Normal size. Normal shape. Effluxed clear urine.  Bladder:  Severe trabeculation. Visualization is limited given debris in the bladder.      The lower urinary tract was carefully examined. The procedure was well-tolerated and without complications. Antibiotic  instructions were given. Instructions were given to call the office immediately for bloody urine, difficulty urinating, urinary retention, painful or frequent urination, fever, chills, nausea, vomiting or other illness. The patient stated that he understood these instructions and would comply with them.          Renal Ultrasound - [id number]  Right Kidney: Length: 10.8 cm Depth: 5.4 cm Cortical Width: 1.2 cm Width:5.1 cm  Left Kidney: Length: 11.3 cm Depth:5.8 cm Cortical Width:1.5 cm Width: 5.0 cm  Left Kidney/Ureter:  There is a mid/lp non obstructing calc noted measuring 5 mm  Right Kidney/Ureter:  Appears wnl  Bladder:  PVR 1507 ml - there is a moderate amount of mobile debris noted in the bladder      . Patient confirmed No Neulasta OnPro Device.          Simple Foley Catheterization - [id number]  A 16 French Foley catheter was inserted into the bladder using sterile technique. The patient was taught routine catheter care. 1250 cc of urine was obtained.         Urinalysis w/Scope Dipstick Dipstick Cont'd Micro  Color: Yellow Bilirubin: Neg mg/dL WBC/hpf: >60/hpf  Appearance: Turbid Ketones: Neg mg/dL RBC/hpf: 0 - 2/hpf  Specific Gravity: 1.020 Blood: Trace ery/uL Bacteria: Mod (26-50/hpf)  pH: 6.0 Protein: Trace mg/dL Cystals: NS (Not Seen)  Glucose: Neg mg/dL Urobilinogen: 0.2 mg/dL  Casts: NS (Not Seen)    Nitrites: Positive Trichomonas: Not Present    Leukocyte Esterase: 3+ leu/uL Mucous: Not Present      Epithelial Cells: 0 - 5/hpf      Yeast: NS (Not Seen)      Sperm: Not Present    ASSESSMENT:      ICD-10 Details  1 GU:   BPH w/LUTS - N40.1   2   Urinary Retention - R33.8   3   Encounter for Prostate Cancer screening - Z12.5    PLAN:           Schedule Return Visit/Planned Activity: 1 Month - Nurse Visit             Note: Foley exchange monthly  Return Visit/Planned Activity: Next Available Appointment - Urodynamics          Document Letter(s):  Created for  Patient: Clinical Summary         Notes:   1. BPH/LUTS/urinary retention:  -Renal bladder ultrasound today with 1.5 L urine in his bladder. 56 French Foley catheter placed with return of 1.25L urine.  -I recommend obtaining urodynamics to assess bladder function.  -Continue tamsulosin  -I will schedule for TURP. His prostate size is 61 g by CT A/P 07/2021. We discussed risk and benefits. He understands small risk of prostate cancer prior. He did have a PSA of 4.99 in the setting of retention and recent infection. He does not wish to undergo prostate biopsy prior to TURP. He understands possibility of uncovering prostate cancer on pathology specimen.   Risks and benefits of transurethral resection of the Prostate were reviewed in detail including infection, bleeding, blood transfusion, injury to bladder/urethra/surrounding structures, erectile dysfunction, urinary incontinence, bladder neck contracture, persistent obstructive and irritative voiding symptoms, and global anesthesia risks including but not limited to CVA, MI, DVT, PE, pneumonia, and death. He expressed understanding and desire to proceed.   #2. Prostate cancer screening: DRE 60 g, no nodules. PSA on 09/15/2019 was 4.9. This is in the setting of urinary retention and recent infection. He does state that he had prior normal PSA values. I did offer a TRUS biopsy prior to TURP but he is anxious to have that she repeat performed. He does understand a small risk of encountering prostate cancer with the specimen. We will plan to trend PSA after the procedure.   CC: Kathryne Eriksson, MD   Urology Preoperative H&P   Chief Complaint: BPH  History of Present Illness: Jancarlo Stampley is a 70 y.o. male with BPH here for TURP. Denies fevers or chills. He is tolerating his catheter well.  Past Medical History:  Diagnosis Date   BPH with urinary obstruction    Essential hypertension    Foley catheter in place    History of COVID-19 07/2021   per pt  mild symptoms that resolved   History of gastroesophageal reflux (GERD)    Incomplete right bundle branch block (RBBB)    Mixed hyperlipidemia    Type 2 diabetes mellitus without complication, without long-term current use of insulin ([place])    followed by pcp   (11-16-2021  per pt checks blood sugar 2-3 times per week, fasting sugar ---140--170)   Urinary retention 07/2021    Past Surgical History:  Procedure Laterality Date   ACHILLES TENDON REPAIR  1996   CATARACT EXTRACTION W/ INTRAOCULAR LENS IMPLANT Bilateral    left 2020;  right 2015   KNEE ARTHROSCOPY Left    1990s  LEG SURGERY Right 1956   per pt circular saw injury to right lower leg/ ankle   MOUTH SURGERY     1980s;   complex laceration of lip/ mouth area due to injury   SHOULDER ARTHROSCOPY WITH ROTATOR CUFF REPAIR AND SUBACROMIAL DECOMPRESSION Left 08/10/2016   @[facility]    TONSILLECTOMY     child    Allergies: No Known Allergies  Family History  Problem Relation Age of Onset   Heart disease Neg Hx     Social History:  reports that he has never smoked. He has never used smokeless tobacco. He reports current alcohol use. He reports that he does not use drugs.  ROS: A complete review of systems was performed.  All systems are negative except for pertinent findings as noted.  Physical Exam:  Vital signs in last 24 hours:   Constitutional:  Alert and oriented, No acute distress Cardiovascular: Regular rate and rhythm Respiratory: Normal respiratory effort, Lungs clear bilaterally GI: Abdomen is soft, nontender, nondistended, no abdominal masses GU: No CVA tenderness Lymphatic: No lymphadenopathy Neurologic: Grossly intact, no focal deficits Psychiatric: Normal mood and affect  Laboratory Data:  No results for input(s): WBC, HGB, HCT, PLT in the last 72 hours.  No results for input(s): NA, K, CL, GLUCOSE, BUN, CALCIUM, CREATININE in the last 72 hours.  Invalid input(s): CO3   No results found for this or  any previous visit (from the past 24 hour(s)). No results found for this or any previous visit (from the past 240 hour(s)).  Renal Function: No results for input(s): CREATININE in the last 168 hours. CrCl cannot be calculated (Patient's most recent lab result is older than the maximum 21 days allowed.).  Radiologic Imaging: No results found.  I independently reviewed the above imaging studies.  Assessment and Plan Kumail Tremain is a 70 y.o. male with BPH here for TURP.   Risks and benefits of transurethral resection of the Prostate were reviewed in detail including infection, bleeding, blood transfusion, injury to bladder/urethra/surrounding structures, erectile dysfunction, urinary incontinence, bladder neck contracture, persistent obstructive and irritative voiding symptoms, and global anesthesia risks including but not limited to CVA, MI, DVT, PE, pneumonia, and death. He expressed understanding and desire to proceed.     Matt R. Nycholas Rayner MD 11/20/2021, 9:34 AM  [facility] Pager: [phone]): [phone]

## 2021-11-21 ENCOUNTER — Encounter (HOSPITAL_BASED_OUTPATIENT_CLINIC_OR_DEPARTMENT_OTHER): Payer: Self-pay | Admitting: Urology

## 2021-11-21 DIAGNOSIS — N401 Enlarged prostate with lower urinary tract symptoms: Secondary | ICD-10-CM | POA: Diagnosis not present

## 2021-11-21 LAB — BASIC METABOLIC PANEL
Anion gap: 8 (ref 5–15)
BUN: 16 mg/dL (ref 8–23)
CO2: 26 mmol/L (ref 22–32)
Calcium: 8.8 mg/dL — ABNORMAL LOW (ref 8.9–10.3)
Chloride: 101 mmol/L (ref 98–111)
Creatinine, Ser: 1.2 mg/dL (ref 0.61–1.24)
GFR, Estimated: >60 mL/min (ref >60)
Glucose, Bld: 214 mg/dL — ABNORMAL HIGH (ref 70–99)
Potassium: 4.4 mmol/L (ref 3.5–5.1)
Sodium: 135 mmol/L (ref 135–145)

## 2021-11-21 LAB — CBC
HCT: 38.4 % — ABNORMAL LOW (ref 39.0–52.0)
Hemoglobin: 12.7 g/dL — ABNORMAL LOW (ref 13.0–17.0)
MCH: 27.7 pg (ref 26.0–34.0)
MCHC: 33.1 g/dL (ref 30.0–36.0)
MCV: 83.8 fL (ref 80.0–100.0)
Platelets: 137 10*3/uL — ABNORMAL LOW (ref 150–400)
RBC: 4.58 MIL/uL (ref 4.22–5.81)
RDW: 14.3 % (ref 11.5–15.5)
WBC: 7.2 10*3/uL (ref 4.0–10.5)
nRBC: 0 % (ref 0.0–0.2)

## 2021-11-21 LAB — GLUCOSE, CAPILLARY: Glucose-Capillary: 155 mg/dL — ABNORMAL HIGH (ref 70–99)

## 2021-11-21 MED ORDER — INSULIN ASPART 100 UNIT/ML IJ SOLN
INTRAMUSCULAR | Status: AC
  Filled 2021-11-21: qty 1

## 2021-11-21 NOTE — Discharge Summary (Signed)
Date of admission: 11/20/2021  Date of discharge: 11/21/2021  Admission diagnosis: BPH  Discharge diagnosis: BPH  Secondary diagnoses: none  History and Physical: For full details, please see admission history and physical. Briefly, Matthew Garza is a 70 y.o. year old patient with BPH.   Hospital Course: The patient recovered in the usual expected fashion.  He had his diet advanced slowly.  Initially managed with IV pain control, then transitioned to PO meds when he was tolerating oral intake.  His labs were stable throughout the hospital course.  He was discharged to home on POD#1.  At the time of discharge the patient was tolerating a regular diet, passing flatus, ambulating, had adequate pain control and was agreeable to discharge.  Follow up as scheduled.    Laboratory values:  Recent Labs    11/20/21 1036 11/21/21 0212  HGB 14.6 12.7*  HCT 43.0 38.4*   Recent Labs    11/20/21 1036 11/21/21 0212  CREATININE 1.20 1.20    Disposition: Home  Discharge instruction: The patient was instructed to be ambulatory but told to refrain from heavy lifting, strenuous activity, or driving.   Discharge medications:  Allergies as of 11/21/2021   No Known Allergies      Medication List     TAKE these medications    Aspirin 81 MG Caps Take 81 mg by mouth at bedtime.   diazepam 10 MG tablet Commonly known as: VALIUM Take 0.5 tablets (5 mg total) by mouth at bedtime as needed for sleep.   docusate sodium 100 MG capsule Commonly known as: Colace Take 1 capsule (100 mg total) by mouth daily as needed for up to 30 doses.   losartan 50 MG tablet Commonly known as: COZAAR Take 50 mg by mouth at bedtime.   metFORMIN 500 MG 24 hr tablet Commonly known as: GLUCOPHAGE-XR Take 100 mg by mouth at bedtime.   oxyCODONE-acetaminophen 5-325 MG tablet Commonly known as: Percocet Take 1 tablet by mouth every 4 (four) hours as needed for up to 12 doses for severe pain.   simvastatin  40 MG tablet Commonly known as: ZOCOR Take 40 mg by mouth at bedtime.   tamsulosin 0.4 MG Caps capsule Commonly known as: FLOMAX Take 1 capsule (0.4 mg total) by mouth daily. What changed: when to take this        Followup:   Follow-up Information     [facility] Follow up on 11/22/2021.   Why: 8:45AM Contact information: [address] [place] [address] [phone]        [facility], Dory Peru, MD .                  [name]. [name] MD [facility]  Pager: [phone]

## 2021-11-23 LAB — SURGICAL PATHOLOGY

## 2022-12-01 IMAGING — CT CT HEAD W/O CM
3 of 4 series · 15 of 47 positions shown, 18 images · non-contrast
Comparison: None.

CLINICAL DATA: Delirium

EXAM:
CT HEAD WITHOUT CONTRAST
TECHNIQUE: Contiguous axial images were obtained from the base of the skull
through the vertex without intravenous contrast.

[Series 3: head 5.0 h30s · axial · 0.52mm/px · z∈[+223,+378]mm · 9 of 37 slices shown, 12 images]
[im 3/37  brain]
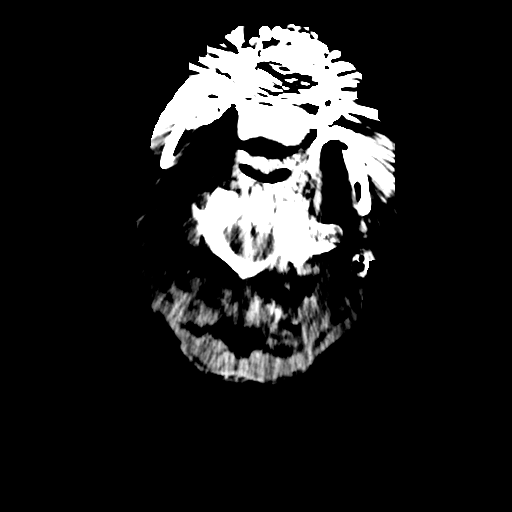
[im 3/37  bone]
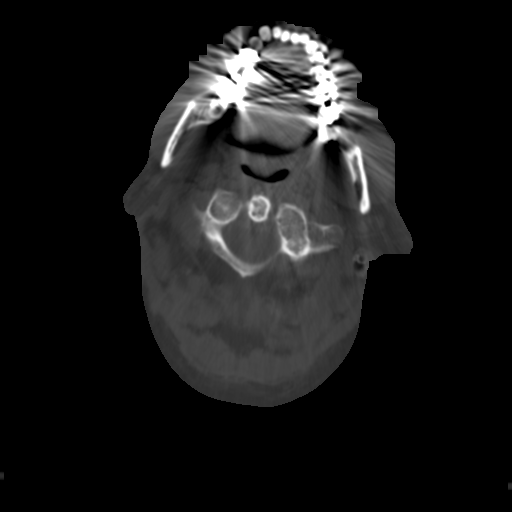
[im 8/37  brain]
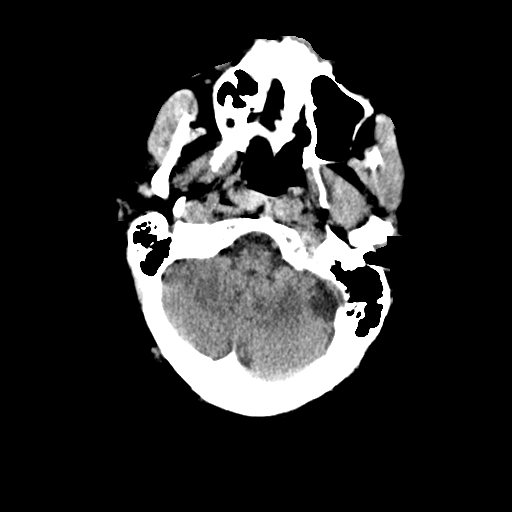
[im 10/37  brain]
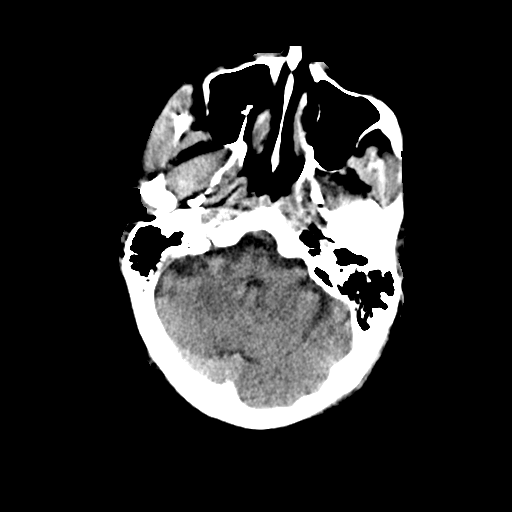
[im 15/37  brain]
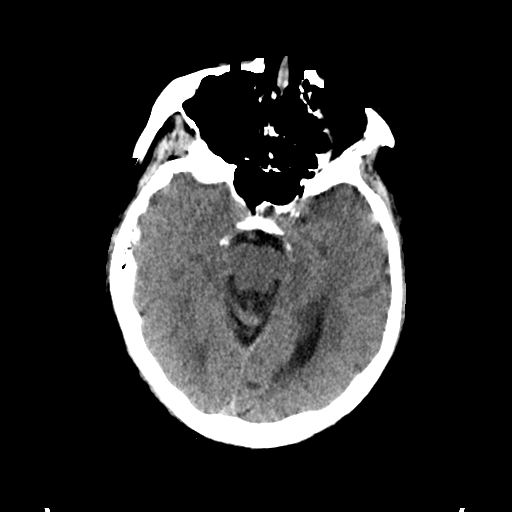
[im 20/37  brain]
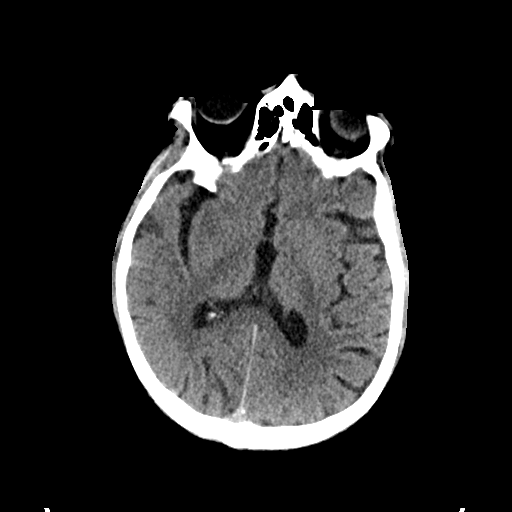
[im 20/37  bone]
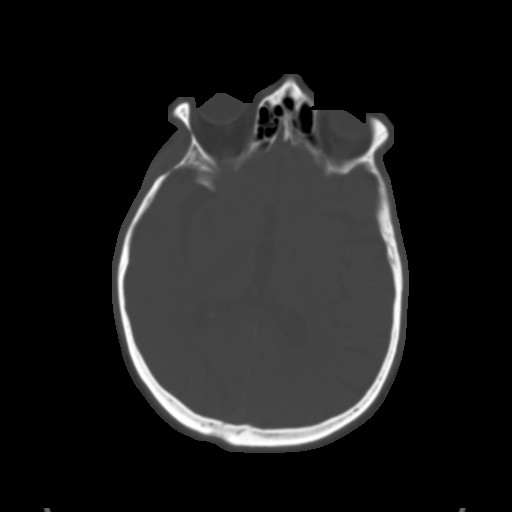
[im 22/37  brain]
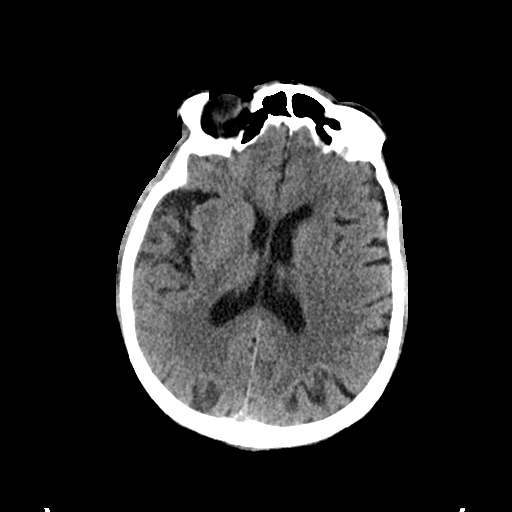
[im 27/37  brain]
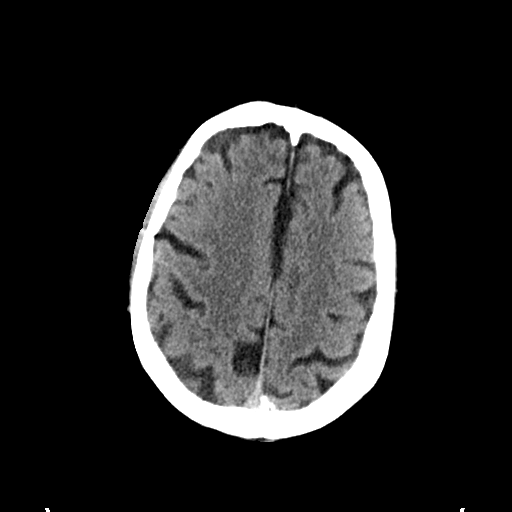
[im 29/37  brain]
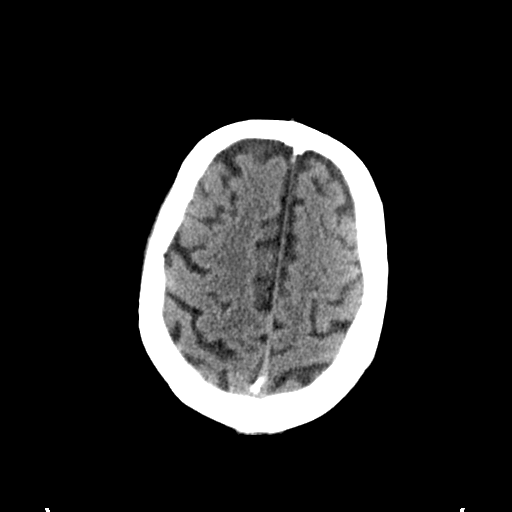
[im 34/37  brain]
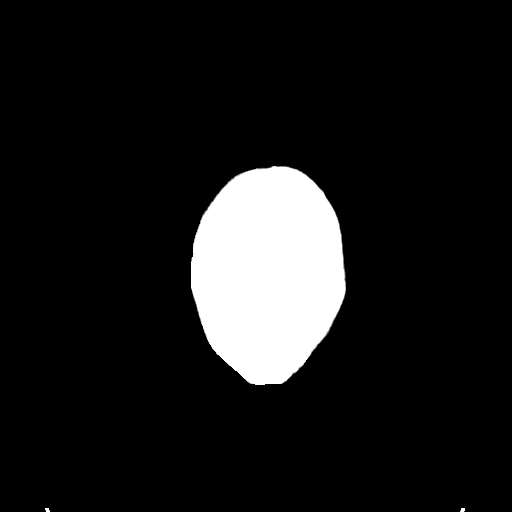
[im 34/37  bone]
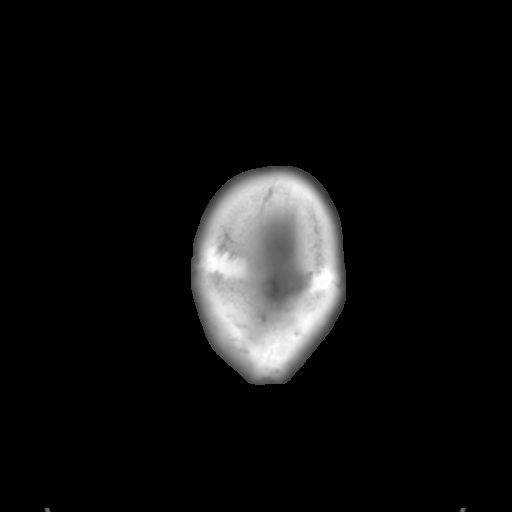

[Series 5: head 3.0 mpr cor · coronal · 0.36mm/px · 3 of 77 slices shown]
[im 26/77  brain]
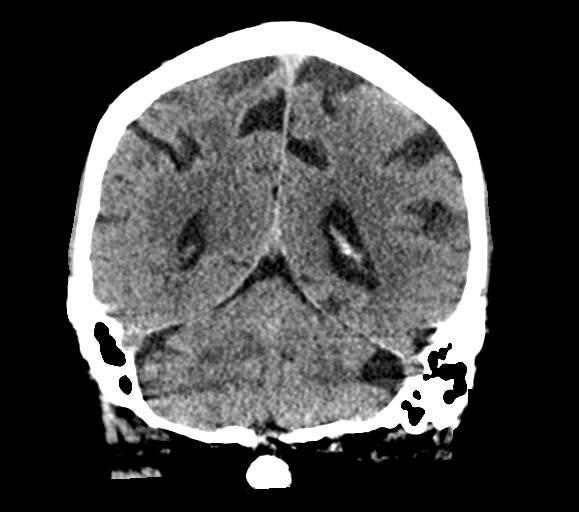
[im 34/77  brain]
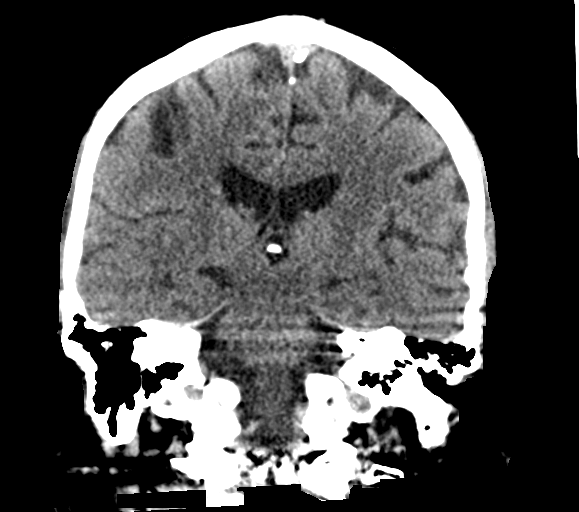
[im 43/77  brain]
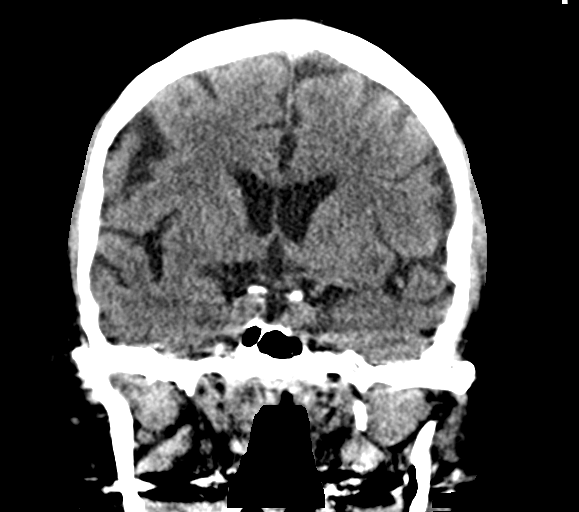

[Series 6: head 3.0 mpr sag · sagittal · 0.36mm/px · 3 of 68 slices shown]
[im 23/68  brain]
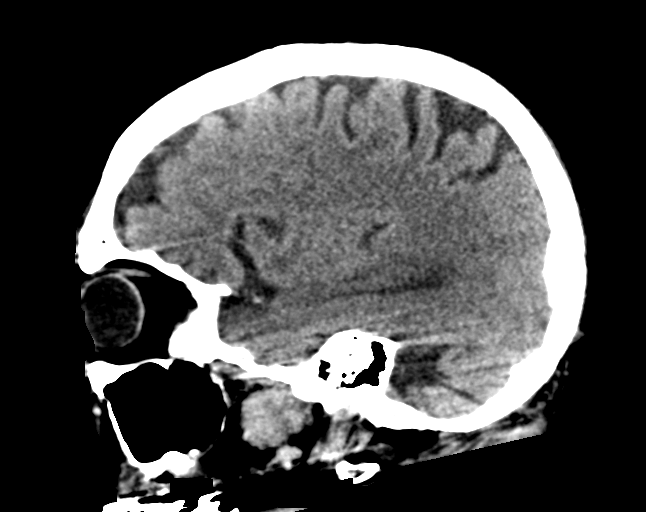
[im 34/68  brain]
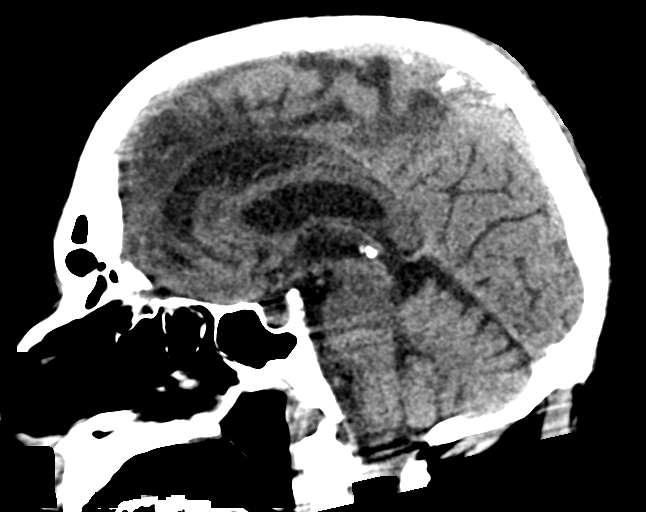
[im 45/68  brain]
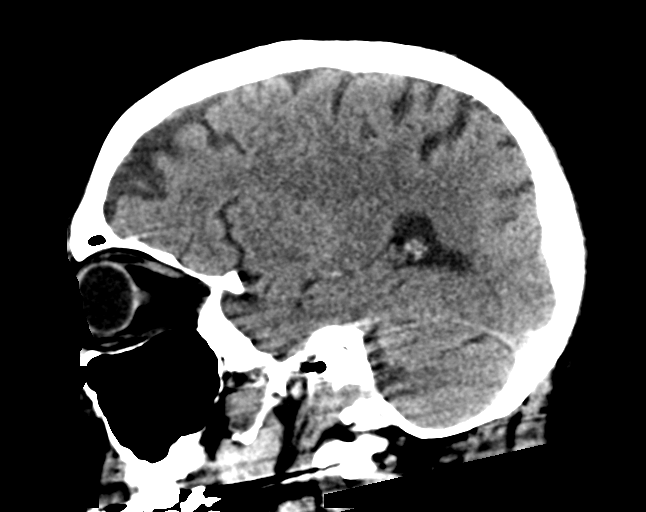

[15 of 47 positions shown; findings below may reference images not displayed]

FINDINGS: Brain: No evidence of acute infarction, hemorrhage, hydrocephalus,
extra-axial collection or mass lesion/mass effect.

Mild cortical atrophy.

Mild subcortical white matter and periventricular small vessel
ischemic changes.

Vascular: No hyperdense vessel or unexpected calcification.

Skull: Normal. Negative for fracture or focal lesion.

Sinuses/Orbits: The visualized paranasal sinuses are essentially
clear. The mastoid air cells are unopacified.

Other: None.
IMPRESSION: No evidence of acute intracranial abnormality. Atrophy with mild
small vessel ischemic changes.

## 2022-12-01 IMAGING — CT CT ABD-PELV W/ CM
2 of 5 series · 16 of 46 positions shown, 18 images · IV contrast (APPLIED)
Comparison: None.

CLINICAL DATA: Abdominal pain.

EXAM:
CT ABDOMEN AND PELVIS WITH CONTRAST
TECHNIQUE: Multidetector CT imaging of the abdomen and pelvis was performed
using the standard protocol following bolus administration of
intravenous contrast.
CONTRAST:  80mL OMNIPAQUE IOHEXOL 350 MG/ML SOLN

[Series 9: abd/ pelvis 5.0 i30f 2 · axial · 0.83mm/px · z∈[-569,-79]mm · 13 of 110 slices shown, 15 images]
[im 6/110  soft-tissue]
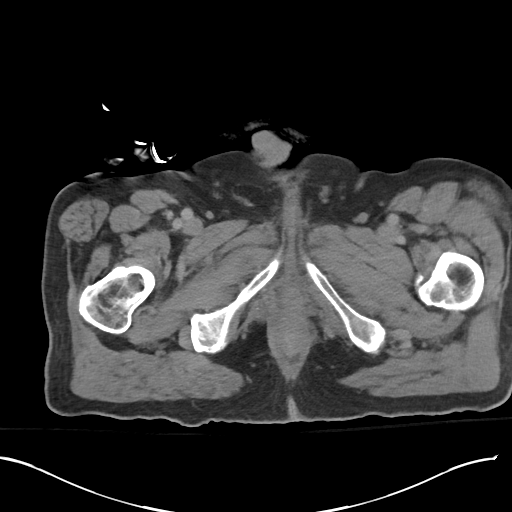
[im 6/110  bone]
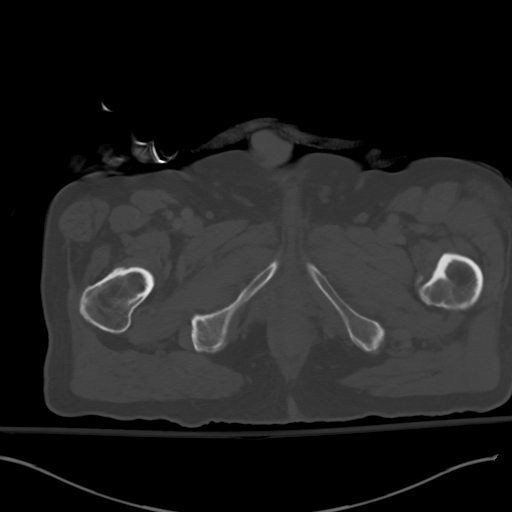
[im 18/110  soft-tissue]
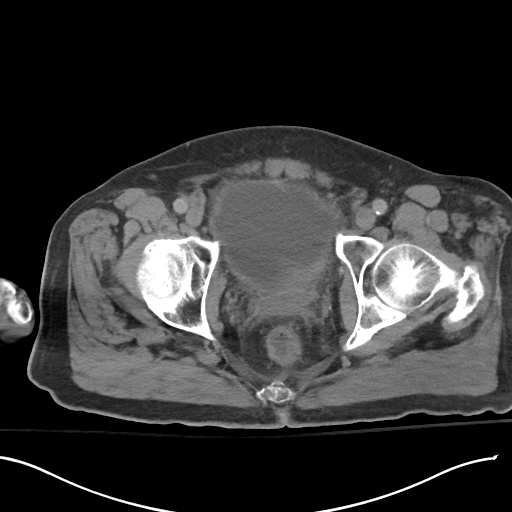
[im 23/110  soft-tissue]
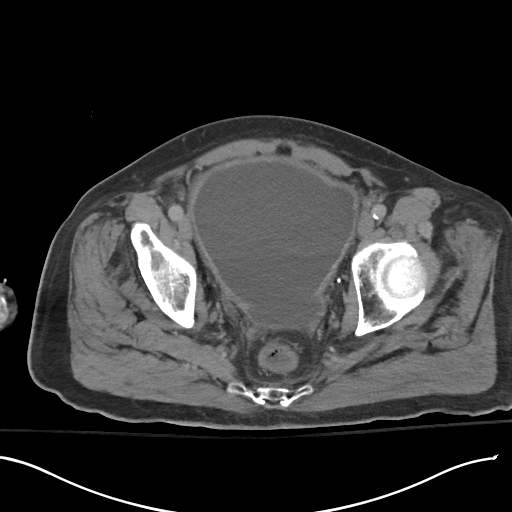
[im 29/110  soft-tissue]
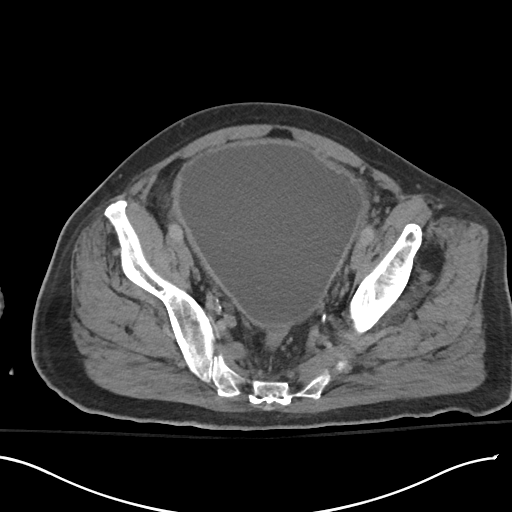
[im 41/110  soft-tissue]
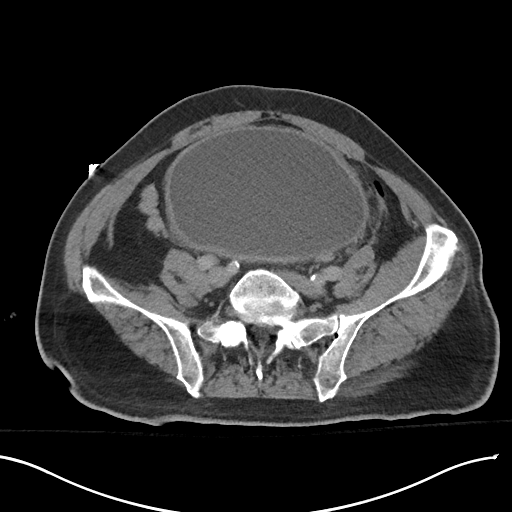
[im 46/110  soft-tissue]
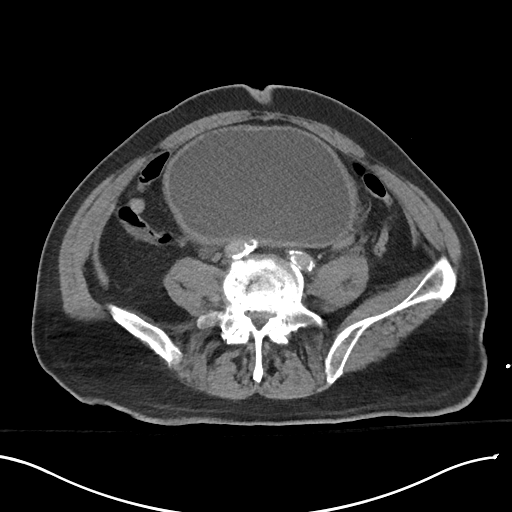
[im 58/110  soft-tissue]
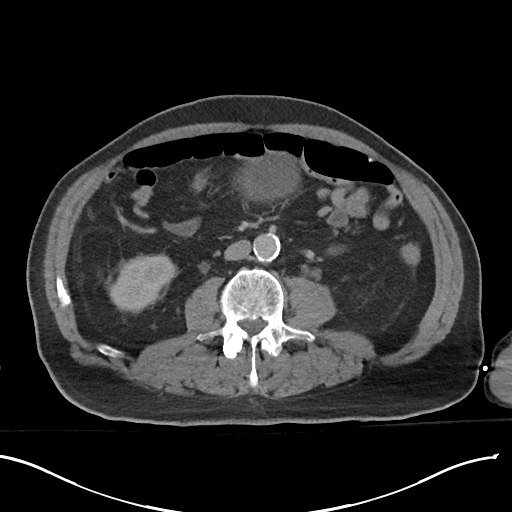
[im 64/110  soft-tissue]
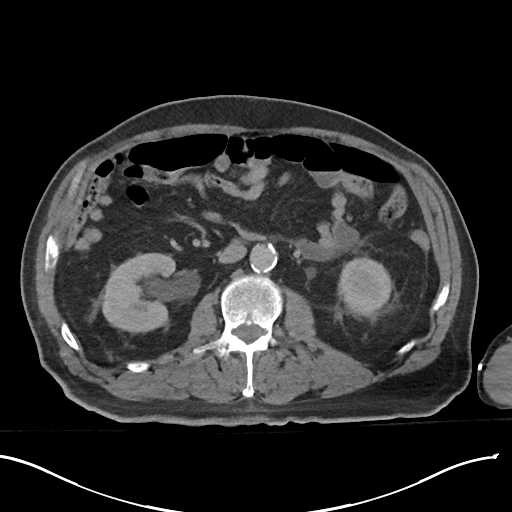
[im 69/110  soft-tissue]
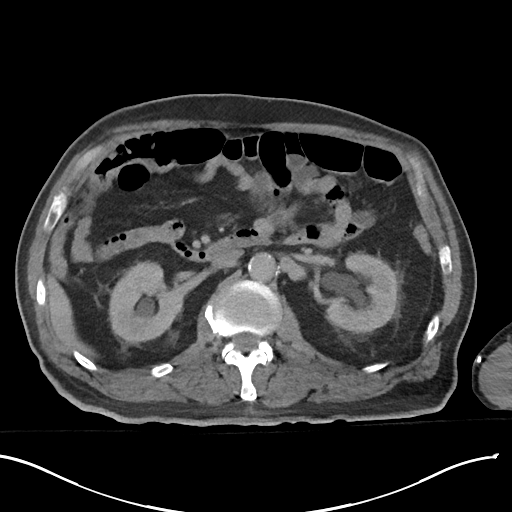
[im 69/110  bone]
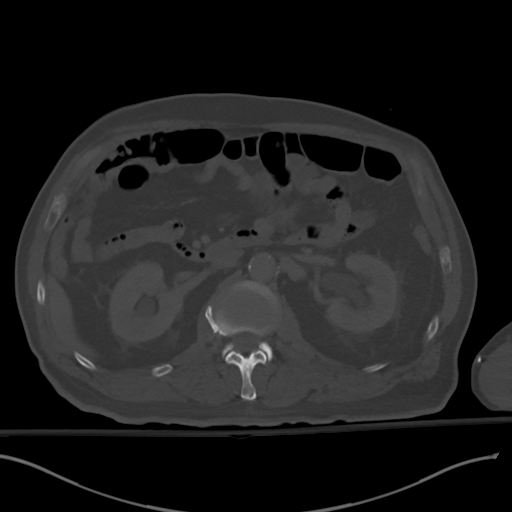
[im 81/110  soft-tissue]
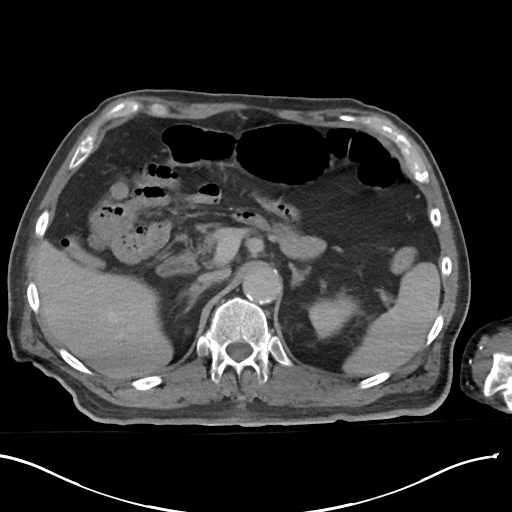
[im 87/110  soft-tissue]
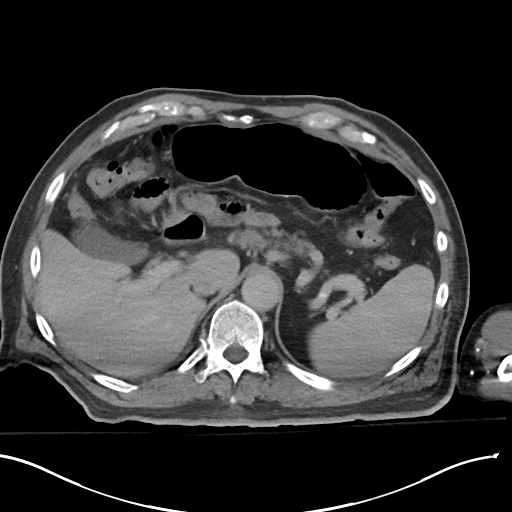
[im 92/110  soft-tissue]
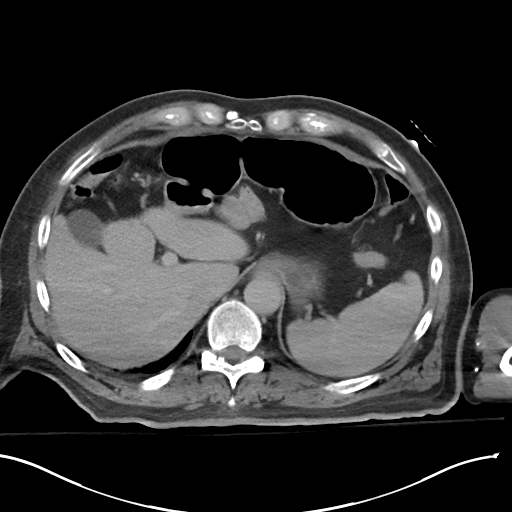
[im 104/110  soft-tissue]
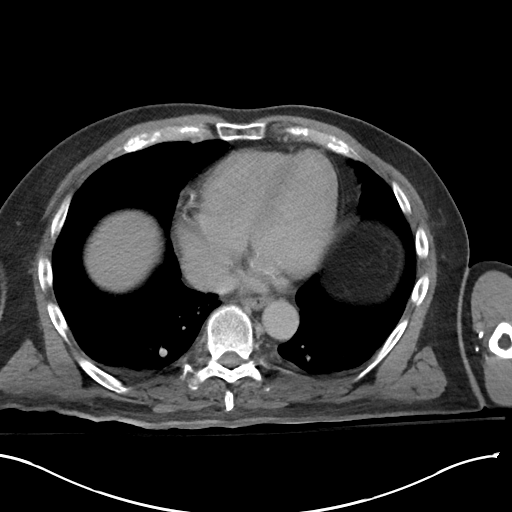

[Series 12: coronal soft tissue · coronal · 1.01mm/px · 3 of 101 slices shown]
[im 34/101  soft-tissue]
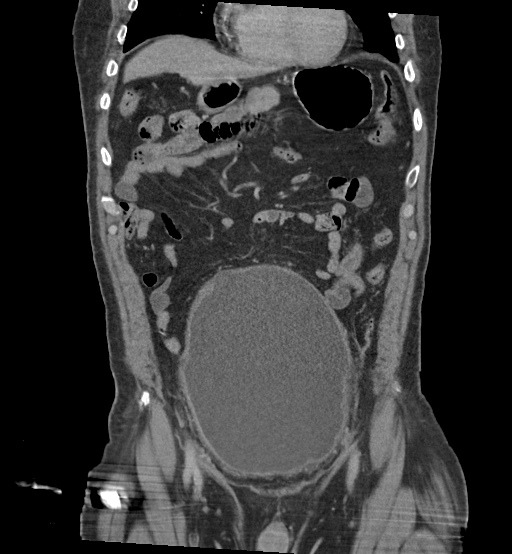
[im 45/101  soft-tissue]
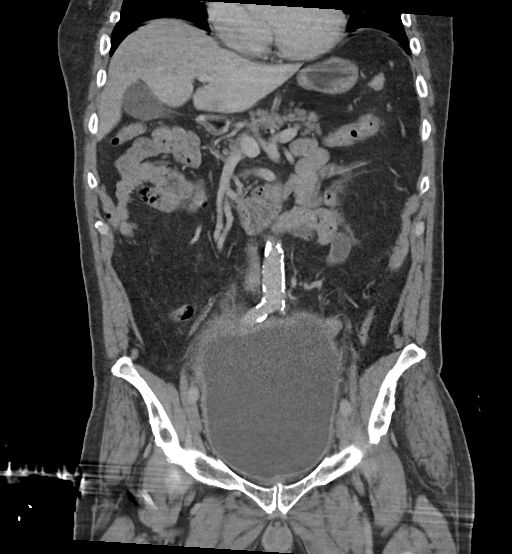
[im 56/101  soft-tissue]
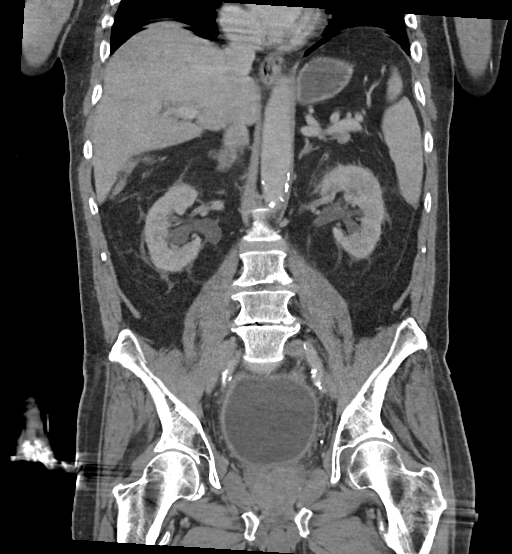

[16 of 46 positions shown; findings below may reference images not displayed]

FINDINGS: Lower chest: Mild atelectasis is seen within the posterior aspects
of the bilateral lung bases.

Very small bilateral pleural effusions are noted.

Hepatobiliary: A 5 mm focus of parenchymal low attenuation is seen
within the right lobe of the liver. This is too small to
characterize by CT examination. No gallstones, gallbladder wall
thickening, or biliary dilatation.

Pancreas: Unremarkable. No pancreatic ductal dilatation or
surrounding inflammatory changes.

Spleen: Normal in size without focal abnormality.

Adrenals/Urinary Tract: Adrenal glands are unremarkable. Kidneys are
normal in size, without focal lesions. Moderate severity bilateral
hydronephrosis and hydroureter are noted, to the level of a markedly
distended urinary bladder. Moderate to marked severity bilateral
perinephric inflammatory fat stranding is also seen. There is mild
inflammatory fat stranding surrounding the urinary bladder.

Stomach/Bowel: Stomach is within normal limits. Appendix appears
normal. No evidence of bowel wall thickening, distention, or
inflammatory changes.

Vascular/Lymphatic: Aortic atherosclerosis. No enlarged abdominal or
pelvic lymph nodes.

Reproductive: Moderate to marked severity prostate gland enlargement
is seen

Other: No abdominal wall hernia or abnormality. No abdominopelvic
ascites.

Musculoskeletal: Degenerative changes are seen within the mid and
lower lumbar spine.
IMPRESSION: 1. Markedly distended urinary bladder with subsequent bilateral
hydronephrosis and hydroureter, likely a result of urinary bladder
outlet obstruction secondary to an enlarged prostate gland.
2. Additional findings consistent with mild cystitis.
3. While the perinephric inflammatory fat stranding is nonspecific
in nature, sequelae associated with acute pyelonephritis cannot be
excluded.
